# Patient Record
Sex: Male | Born: 1990 | Race: Black or African American | Hispanic: No | Marital: Single | Smoking: Former smoker
Health system: Southern US, Community
[De-identification: ages and names within clinical notes are randomized; demographics above are authoritative.]

## PROBLEM LIST (undated history)

## (undated) DIAGNOSIS — L309 Dermatitis, unspecified: Secondary | ICD-10-CM

## (undated) HISTORY — PX: NO PAST SURGERIES: SHX2092

---
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
## 2009-10-02 ENCOUNTER — Emergency Department: Payer: Self-pay | Admitting: Emergency Medicine

## 2010-02-07 ENCOUNTER — Emergency Department: Payer: Self-pay | Admitting: Emergency Medicine

## 2010-04-17 ENCOUNTER — Emergency Department: Payer: Self-pay | Admitting: Emergency Medicine

## 2010-04-20 ENCOUNTER — Emergency Department: Payer: Self-pay | Admitting: Emergency Medicine

## 2010-09-09 ENCOUNTER — Emergency Department: Payer: Self-pay | Admitting: Unknown Physician Specialty

## 2010-10-03 ENCOUNTER — Emergency Department: Payer: Self-pay | Admitting: Emergency Medicine

## 2011-01-10 ENCOUNTER — Emergency Department: Payer: Self-pay | Admitting: Emergency Medicine

## 2011-04-29 ENCOUNTER — Emergency Department: Payer: Self-pay | Admitting: *Deleted

## 2011-12-29 ENCOUNTER — Emergency Department: Payer: Self-pay | Admitting: Emergency Medicine

## 2012-03-30 ENCOUNTER — Emergency Department: Payer: Self-pay | Admitting: Emergency Medicine

## 2012-09-14 ENCOUNTER — Emergency Department: Payer: Self-pay | Admitting: Emergency Medicine

## 2012-09-14 LAB — RAPID INFLUENZA A&B ANTIGENS

## 2012-10-29 ENCOUNTER — Emergency Department: Payer: Self-pay | Admitting: Emergency Medicine

## 2012-12-12 ENCOUNTER — Emergency Department: Payer: Self-pay | Admitting: Emergency Medicine

## 2012-12-28 ENCOUNTER — Emergency Department: Payer: Self-pay | Admitting: Emergency Medicine

## 2012-12-28 LAB — URINALYSIS, COMPLETE
Bacteria: NONE SEEN
Nitrite: NEGATIVE
Ph: 9 (ref 4.5–8.0)
RBC,UR: 25 /HPF (ref 0–5)
Specific Gravity: 1.026 (ref 1.003–1.030)
Squamous Epithelial: <1

## 2014-07-12 ENCOUNTER — Emergency Department: Payer: Self-pay | Admitting: Student

## 2014-07-12 LAB — URINALYSIS, COMPLETE
BILIRUBIN, UR: NEGATIVE
BLOOD: NEGATIVE
Bacteria: NONE SEEN
GLUCOSE, UR: NEGATIVE mg/dL (ref 0–75)
Ketone: NEGATIVE
Leukocyte Esterase: NEGATIVE
Nitrite: NEGATIVE
PH: 6 (ref 4.5–8.0)
Protein: NEGATIVE
Specific Gravity: 1.028 (ref 1.003–1.030)
Squamous Epithelial: NONE SEEN
WBC UR: 1 /HPF (ref 0–5)

## 2014-07-12 LAB — GC/CHLAMYDIA PROBE AMP

## 2015-08-30 ENCOUNTER — Emergency Department
Admission: EM | Admit: 2015-08-30 | Discharge: 2015-08-30 | Disposition: A | Discharge status: Home / Self Care | Payer: BLUE CROSS/BLUE SHIELD | Attending: Emergency Medicine | Admitting: Emergency Medicine

## 2015-08-30 ENCOUNTER — Encounter: Payer: Self-pay | Admitting: *Deleted

## 2015-08-30 ENCOUNTER — Emergency Department: Payer: BLUE CROSS/BLUE SHIELD

## 2015-08-30 DIAGNOSIS — S91302A Unspecified open wound, left foot, initial encounter: Secondary | ICD-10-CM | POA: Diagnosis not present

## 2015-08-30 DIAGNOSIS — Y9289 Other specified places as the place of occurrence of the external cause: Secondary | ICD-10-CM | POA: Insufficient documentation

## 2015-08-30 DIAGNOSIS — S91301A Unspecified open wound, right foot, initial encounter: Secondary | ICD-10-CM | POA: Diagnosis not present

## 2015-08-30 DIAGNOSIS — Y998 Other external cause status: Secondary | ICD-10-CM | POA: Insufficient documentation

## 2015-08-30 DIAGNOSIS — Y9389 Activity, other specified: Secondary | ICD-10-CM | POA: Diagnosis not present

## 2015-08-30 DIAGNOSIS — Z23 Encounter for immunization: Secondary | ICD-10-CM | POA: Diagnosis not present

## 2015-08-30 DIAGNOSIS — W3400XA Accidental discharge from unspecified firearms or gun, initial encounter: Secondary | ICD-10-CM

## 2015-08-30 MED ORDER — CEFAZOLIN SODIUM 1-5 GM-% IV SOLN
1.0000 g | Freq: Once | INTRAVENOUS | Status: AC
  Administered 2015-08-30: 1 g via INTRAVENOUS

## 2015-08-30 MED ORDER — BACITRACIN-NEOMYCIN-POLYMYXIN 400-5-5000 EX OINT: TOPICAL_OINTMENT | Freq: Once | CUTANEOUS | Status: DC

## 2015-08-30 MED ORDER — BACITRACIN ZINC 500 UNIT/GM EX OINT
TOPICAL_OINTMENT | CUTANEOUS | Status: AC
  Administered 2015-08-30: 1
  Filled 2015-08-30: qty 0.9

## 2015-08-30 MED ORDER — TETANUS-DIPHTH-ACELL PERTUSSIS 5-2.5-18.5 LF-MCG/0.5 IM SUSP
0.5000 mL | Freq: Once | INTRAMUSCULAR | Status: AC
  Administered 2015-08-30: 0.5 mL via INTRAMUSCULAR
  Filled 2015-08-30: qty 0.5

## 2015-08-30 MED ORDER — ACETAMINOPHEN 325 MG PO TABS
650.0000 mg | ORAL_TABLET | Freq: Once | ORAL | Status: AC
  Administered 2015-08-30: 650 mg via ORAL
  Filled 2015-08-30: qty 2

## 2015-08-30 MED ORDER — CEFAZOLIN SODIUM 1-5 GM-% IV SOLN
INTRAVENOUS | Status: AC
  Administered 2015-08-30: 1 g via INTRAVENOUS
  Filled 2015-08-30: qty 50

## 2015-08-30 NOTE — ED Notes (Signed)
All belongings in paper bag given to DETECTIVE al smith.

## 2015-08-30 NOTE — ED Notes (Signed)
Bacitracin and dsd to right le and left foot with kerlix.

## 2015-08-30 NOTE — ED Notes (Signed)
All clothes in paper bag at nursing station with patient label on them.
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
## 2015-08-30 NOTE — ED Notes (Signed)
X-ray at bedside
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
## 2015-08-30 NOTE — ED Notes (Signed)
MD norman at bedside

## 2016-08-01 ENCOUNTER — Emergency Department
Admission: EM | Admit: 2016-08-01 | Discharge: 2016-08-01 | Disposition: A | Discharge status: Home / Self Care | Payer: BLUE CROSS/BLUE SHIELD | Attending: Emergency Medicine | Admitting: Emergency Medicine

## 2016-08-01 ENCOUNTER — Encounter: Payer: Self-pay | Admitting: Emergency Medicine

## 2016-08-01 DIAGNOSIS — R369 Urethral discharge, unspecified: Secondary | ICD-10-CM

## 2016-08-01 DIAGNOSIS — N342 Other urethritis: Secondary | ICD-10-CM

## 2016-08-01 LAB — URINALYSIS COMPLETE WITH MICROSCOPIC (ARMC ONLY)
BILIRUBIN URINE: NEGATIVE
Bacteria, UA: NONE SEEN
GLUCOSE, UA: NEGATIVE mg/dL
KETONES UR: NEGATIVE mg/dL
NITRITE: NEGATIVE
Protein, ur: 30 mg/dL — AB
Specific Gravity, Urine: 1.023 (ref 1.005–1.030)
Squamous Epithelial / LPF: NONE SEEN
pH: 6 (ref 5.0–8.0)

## 2016-08-01 LAB — CHLAMYDIA/NGC RT PCR (ARMC ONLY)
Chlamydia Tr: NOT DETECTED
N GONORRHOEAE: DETECTED — AB

## 2016-08-01 MED ORDER — CEFTRIAXONE SODIUM 1 G IJ SOLR
500.0000 mg | Freq: Once | INTRAMUSCULAR | Status: AC
  Administered 2016-08-01: 500 mg via INTRAMUSCULAR
  Filled 2016-08-01: qty 10

## 2016-08-01 MED ORDER — AZITHROMYCIN 500 MG PO TABS
1000.0000 mg | ORAL_TABLET | Freq: Once | ORAL | Status: AC
  Administered 2016-08-01: 1000 mg via ORAL
  Filled 2016-08-01: qty 2

## 2016-08-01 NOTE — Discharge Instructions (Signed)
Please avoid any sexual activity for one week. Follow-up with health department for recheck in one week. Return to the ER for any worsening symptoms urgent changes in her health.

## 2016-08-01 NOTE — ED Provider Notes (Signed)
[facility]-EMERGENCY DEPARTMENT Provider Note   CSN: [id number] Arrival date & time: 08/01/16  1751     History   Chief Complaint Chief Complaint  Patient presents with  . Exposure to STD    HPI Charles Whitehead is a 25 y.o. male for evaluation of penile discharge. Patient has had one day of penile discharge and burning with urination. Patient has had a new male sexual partner for the last 3-4 weeks. She denies any symptoms. Patient denies any fevers, back pain, abdominal pain. He has no history of STD. He denies any testicular pain or swelling. Patient's pain is moderate to severe with urination he describes it as burning. He has no pain with rest .   HPI  History reviewed. No pertinent past medical history.  There are no active problems to display for this patient.   History reviewed. No pertinent surgical history.     Home Medications    Prior to Admission medications   Not on File    Family History No family history on file.  Social History Social History  Substance Use Topics  . Smoking status: Never Smoker  . Smokeless tobacco: Never Used  . Alcohol use No     Allergies   Patient has no known allergies.   Review of Systems Review of Systems  Constitutional: Negative.  Negative for activity change, appetite change, chills and fever.  HENT: Negative for congestion, ear pain, mouth sores, rhinorrhea, sinus pressure, sore throat and trouble swallowing.   Eyes: Negative for photophobia, pain and discharge.  Respiratory: Negative for cough, chest tightness and shortness of breath.   Cardiovascular: Negative for chest pain and leg swelling.  Gastrointestinal: Negative for abdominal distention, abdominal pain, diarrhea, nausea and vomiting.  Genitourinary: Positive for discharge and dysuria. Negative for difficulty urinating, penile swelling, scrotal swelling, testicular pain and urgency.  Musculoskeletal: Negative for arthralgias, back pain and gait problem.    Skin: Negative for color change and rash.  Neurological: Negative for dizziness and headaches.  Hematological: Negative for adenopathy.  Psychiatric/Behavioral: Negative for agitation and behavioral problems.     Physical Exam Updated Vital Signs BP 121/76 (BP Location: Right Arm)   Pulse (!) 57   Temp 98.4 F (36.9 C) (Oral)   Resp 18   Ht 5\' 8"  (1.727 m)   Wt 68 kg   SpO2 98%   BMI 22.81 kg/m   Physical Exam  Constitutional: He appears well-developed and well-nourished.  HENT:  Head: Normocephalic and atraumatic.  Eyes: Conjunctivae and EOM are normal. Pupils are equal, round, and reactive to light.  Neck: Normal range of motion. Neck supple.  Cardiovascular: Normal rate and regular rhythm.   No murmur heard. Pulmonary/Chest: Effort normal and breath sounds normal. No respiratory distress.  Abdominal: Soft. There is no tenderness.  Genitourinary: Penis normal. Right testis shows no mass, no swelling and no tenderness. Right testis is descended. Left testis shows no mass, no swelling and no tenderness. Left testis is descended. Circumcised. No penile tenderness.  Genitourinary Comments: Mild clear penile discharge with no lesions on the penile shaft. No scrotal tenderness or swelling. No epididymal tenderness.  Musculoskeletal: He exhibits no edema.  Neurological: He is alert.  Skin: Skin is warm and dry.  Psychiatric: He has a normal mood and affect.  Nursing note and vitals reviewed.    ED Treatments / Results  Labs (all labs ordered are listed, but only abnormal results are displayed) Labs Reviewed  URINALYSIS COMPLETEWITH MICROSCOPIC ([facility] ONLY) - Abnormal;  Notable for the following:       Result Value   Color, Urine YELLOW (*)    APPearance HAZY (*)    Hgb urine dipstick 1+ (*)    Protein, ur 30 (*)    Leukocytes, UA 3+ (*)    All other components within normal limits  CHLAMYDIA/NGC RT PCR [facility] ONLY)    EKG  EKG Interpretation None        Radiology No results found.  Procedures Procedures (including critical care time)  Medications Ordered in ED Medications  cefTRIAXone (ROCEPHIN) injection 500 mg (500 mg Intramuscular Given 08/01/16 1845)  azithromycin (ZITHROMAX) tablet 1,000 mg (1,000 mg Oral Given 08/01/16 1844)     Initial Impression / Assessment and Plan / ED Course  I have reviewed the triage vital signs and the nursing notes.  Pertinent labs & imaging results that were available during my care of the patient were reviewed by me and considered in my medical decision making (see chart for details).  Clinical Course     25 year old male with urethritis. He is treated for gonorrhea and chlamydia. He'll follow with health department in one week for repeat evaluation. Return to the ER for any worsening symptoms urgent changes in his health. He'll avoid any sexual activity until after follow-up with health Department symptoms have resolved. No he will notify sexual partners.  Final Clinical Impressions(s) / ED Diagnoses   Final diagnoses:  Penile discharge  Urethritis    New Prescriptions New Prescriptions   No medications on file     Evon Slackhomas C Saintclair Schroader, PA-C 08/01/16 1907    Minna AntisKevin Paduchowski, MD 08/01/16 2255

## 2016-08-01 NOTE — ED Triage Notes (Signed)
Pt in via POV with complaints of penile pain and yellow discharge with urination x one day.  Pt reports new sexual partner within the last three weeks.  Pt A/Ox4, vitals WDL, no immediate distress noted.

## 2016-08-04 ENCOUNTER — Telehealth: Payer: Self-pay | Admitting: Emergency Medicine

## 2016-08-04 NOTE — Telephone Encounter (Signed)
Called patient to give std results to patient.  He was treated while here.  I left message asking him to call me.

## 2017-04-04 ENCOUNTER — Encounter: Payer: Self-pay | Admitting: Emergency Medicine

## 2017-04-04 ENCOUNTER — Emergency Department: Payer: Self-pay

## 2017-04-04 ENCOUNTER — Emergency Department
Admission: EM | Admit: 2017-04-04 | Discharge: 2017-04-04 | Disposition: A | Discharge status: Home / Self Care | Payer: Self-pay | Attending: Emergency Medicine | Admitting: Emergency Medicine

## 2017-04-04 DIAGNOSIS — M549 Dorsalgia, unspecified: Secondary | ICD-10-CM

## 2017-04-04 DIAGNOSIS — F17290 Nicotine dependence, other tobacco product, uncomplicated: Secondary | ICD-10-CM | POA: Insufficient documentation

## 2017-04-04 DIAGNOSIS — S3992XA Unspecified injury of lower back, initial encounter: Secondary | ICD-10-CM | POA: Insufficient documentation

## 2017-04-04 DIAGNOSIS — S161XXA Strain of muscle, fascia and tendon at neck level, initial encounter: Secondary | ICD-10-CM | POA: Insufficient documentation

## 2017-04-04 DIAGNOSIS — Y998 Other external cause status: Secondary | ICD-10-CM | POA: Insufficient documentation

## 2017-04-04 DIAGNOSIS — G501 Atypical facial pain: Secondary | ICD-10-CM | POA: Insufficient documentation

## 2017-04-04 DIAGNOSIS — Y929 Unspecified place or not applicable: Secondary | ICD-10-CM | POA: Insufficient documentation

## 2017-04-04 DIAGNOSIS — Y9389 Activity, other specified: Secondary | ICD-10-CM | POA: Insufficient documentation

## 2017-04-04 LAB — URINE DRUG SCREEN, QUALITATIVE (ARMC ONLY)
Amphetamines, Ur Screen: NOT DETECTED
BARBITURATES, UR SCREEN: NOT DETECTED
Benzodiazepine, Ur Scrn: NOT DETECTED
CANNABINOID 50 NG, UR ~~LOC~~: POSITIVE — AB
Cocaine Metabolite,Ur ~~LOC~~: NOT DETECTED
MDMA (Ecstasy)Ur Screen: NOT DETECTED
Methadone Scn, Ur: NOT DETECTED
Opiate, Ur Screen: NOT DETECTED
PHENCYCLIDINE (PCP) UR S: NOT DETECTED
TRICYCLIC, UR SCREEN: NOT DETECTED

## 2017-04-04 MED ORDER — NAPROXEN 500 MG PO TABS
500.0000 mg | ORAL_TABLET | Freq: Once | ORAL | Status: AC
  Administered 2017-04-04: 500 mg via ORAL
  Filled 2017-04-04: qty 1

## 2017-04-04 MED ORDER — NAPROXEN 500 MG PO TABS: 500.0000 mg | ORAL_TABLET | Freq: Two times a day (BID) | ORAL | 0 refills | Status: AC

## 2017-04-04 NOTE — ED Notes (Signed)
Pt given pair of socks. No shoes or socks on arrival
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
## 2017-11-04 ENCOUNTER — Emergency Department
Admission: EM | Admit: 2017-11-04 | Discharge: 2017-11-04 | Disposition: A | Discharge status: Home / Self Care | Payer: BLUE CROSS/BLUE SHIELD | Attending: Emergency Medicine | Admitting: Emergency Medicine

## 2017-11-04 ENCOUNTER — Encounter: Payer: Self-pay | Admitting: Emergency Medicine

## 2017-11-04 ENCOUNTER — Other Ambulatory Visit: Payer: Self-pay

## 2017-11-04 DIAGNOSIS — B349 Viral infection, unspecified: Secondary | ICD-10-CM | POA: Insufficient documentation

## 2017-11-04 DIAGNOSIS — J069 Acute upper respiratory infection, unspecified: Secondary | ICD-10-CM | POA: Insufficient documentation

## 2017-11-04 DIAGNOSIS — H9202 Otalgia, left ear: Secondary | ICD-10-CM | POA: Insufficient documentation

## 2017-11-04 DIAGNOSIS — F17290 Nicotine dependence, other tobacco product, uncomplicated: Secondary | ICD-10-CM | POA: Insufficient documentation

## 2017-11-04 DIAGNOSIS — Z79899 Other long term (current) drug therapy: Secondary | ICD-10-CM | POA: Insufficient documentation

## 2017-11-04 LAB — INFLUENZA PANEL BY PCR (TYPE A & B)
Influenza A By PCR: NEGATIVE
Influenza B By PCR: NEGATIVE

## 2017-11-04 MED ORDER — ONDANSETRON 4 MG PO TBDP: 4.0000 mg | ORAL_TABLET | Freq: Three times a day (TID) | ORAL | 0 refills | Status: DC | PRN

## 2017-11-04 MED ORDER — PSEUDOEPH-BROMPHEN-DM 30-2-10 MG/5ML PO SYRP: 5.0000 mL | ORAL_SOLUTION | Freq: Four times a day (QID) | ORAL | 0 refills | Status: DC | PRN

## 2017-11-04 MED ORDER — IBUPROFEN 800 MG PO TABS
800.0000 mg | ORAL_TABLET | Freq: Once | ORAL | Status: AC
  Administered 2017-11-04: 800 mg via ORAL
  Filled 2017-11-04: qty 1

## 2017-11-04 NOTE — Discharge Instructions (Signed)
Please alternate Tylenol and ibuprofen as needed for fevers and body aches.  Take cough medication as needed for cough congestion runny nose.  Use Zofran as needed for nausea.  Make sure drinking lots of fluids.  Return to the ER for any worsening symptoms or urgent changes

## 2017-11-04 NOTE — ED Provider Notes (Signed)
[facility] EMERGENCY DEPARTMENT Provider Note   CSN: [id number] Arrival date & time: 11/04/17  1912     History   Chief Complaint Chief Complaint  Patient presents with  . Nasal Congestion  . Fever  . Otalgia    HPI Charles Whitehead is a 27 y.o. male presents to the emergency department for evaluation of cough, congestion, fever, runny nose, left ear pain, nausea and vomiting.  Patient states symptoms began 2 days ago.  He has had fevers up to 101.  He has taken Tylenol but no ibuprofen.  He denies any chest pain, shortness of breath.  Cough is nonproductive.  He currently denies any abdominal pain but occasionally will have some nausea without vomiting.  Patient last vomited 24 hours ago.  He is also had a couple of episodes of loose stools.   HPI  History reviewed. No pertinent past medical history.  There are no active problems to display for this patient.   History reviewed. No pertinent surgical history.     Home Medications    Prior to Admission medications   Medication Sig Start Date End Date Taking? Authorizing Provider  brompheniramine-pseudoephedrine-DM 30-2-10 MG/5ML syrup Take 5 mLs by mouth 4 (four) times daily as needed. 11/04/17   Evon Slack, PA-C  naproxen (NAPROSYN) 500 MG tablet Take 1 tablet (500 mg total) by mouth 2 (two) times daily with a meal. 04/04/17 04/04/18  Little, Traci M, PA-C  ondansetron (ZOFRAN ODT) 4 MG disintegrating tablet Take 1 tablet (4 mg total) by mouth every 8 (eight) hours as needed for nausea or vomiting. 11/04/17   Evon Slack, PA-C    Family History No family history on file.  Social History Social History   Tobacco Use  . Smoking status: Current Every Day Smoker    Types: Cigars  . Smokeless tobacco: Never Used  Substance Use Topics  . Alcohol use: No  . Drug use: Yes    Types: Marijuana     Allergies   Patient has no known allergies.   Review of Systems Review of Systems    Constitutional: Positive for fever.  HENT: Positive for congestion, rhinorrhea and sore throat. Negative for sinus pressure, sinus pain and trouble swallowing.   Respiratory: Positive for cough. Negative for wheezing and stridor.   Gastrointestinal: Positive for diarrhea, nausea and vomiting. Negative for abdominal pain.  Genitourinary: Negative for dysuria.  Musculoskeletal: Positive for myalgias. Negative for arthralgias and neck stiffness.  Skin: Negative for rash.  Neurological: Negative for dizziness.     Physical Exam Updated Vital Signs BP (!) 141/75 (BP Location: Left Arm)   Pulse 99   Temp 97.9 F (36.6 C) (Oral)   Resp 20   Ht 5\' 7"  (1.702 m)   Wt 65.8 kg (145 lb)   SpO2 97%   BMI 22.71 kg/m   Physical Exam  Constitutional: He is oriented to person, place, and time. He appears well-developed and well-nourished. No distress.  HENT:  Head: Normocephalic and atraumatic.  Right Ear: Hearing, tympanic membrane, external ear and ear canal normal.  Left Ear: Hearing, tympanic membrane, external ear and ear canal normal.  Nose: Rhinorrhea present. No nasal septal hematoma. Right sinus exhibits no maxillary sinus tenderness and no frontal sinus tenderness. Left sinus exhibits no maxillary sinus tenderness and no frontal sinus tenderness.  Mouth/Throat: Oropharynx is clear and moist. No trismus in the jaw. No uvula swelling. No oropharyngeal exudate, posterior oropharyngeal edema, posterior oropharyngeal erythema or tonsillar  abscesses.  No pharyngeal erythema or exudates.  Uvula is midline.  Mild rhinorrhea with mild mucosal edema.  Bilateral TMs are normal with no signs of infection or perforation.  Eyes: Conjunctivae are normal.  Neck: Normal range of motion.  Cardiovascular: Normal rate and regular rhythm.  Pulmonary/Chest: No stridor. No respiratory distress. He has no wheezes. He exhibits no tenderness.  Abdominal: Soft. He exhibits no distension. There is no tenderness.  There is no guarding.  Musculoskeletal: Normal range of motion.  Lymphadenopathy:    He has cervical adenopathy (posterior cervical).  Neurological: He is alert and oriented to person, place, and time.  Skin: Skin is warm and dry. No rash noted.  Psychiatric: He has a normal mood and affect. His behavior is normal. Judgment and thought content normal.     ED Treatments / Results  Labs (all labs ordered are listed, but only abnormal results are displayed) Labs Reviewed  INFLUENZA PANEL BY PCR (TYPE A & B)    EKG  EKG Interpretation None       Radiology No results found.  Procedures Procedures (including critical care time)  Medications Ordered in ED Medications  ibuprofen (ADVIL,MOTRIN) tablet 800 mg (800 mg Oral Given 11/04/17 2053)     Initial Impression / Assessment and Plan / ED Course  I have reviewed the triage vital signs and the nursing notes.  Pertinent labs & imaging results that were available during my care of the patient were reviewed by me and considered in my medical decision making (see chart for details).     27 year old male with viral illness.  He has had intermittent fevers that responded well to Tylenol.  Nausea and vomiting mostly resolved, has mild intermittent nausea.  He is given prescription for Zofran, Bromfed.  He is educated on signs and symptoms to return to the ED for.  He will increase fluids.  Final Clinical Impressions(s) / ED Diagnoses   Final diagnoses:  Viral upper respiratory tract infection  Left ear pain  Viral illness    ED Discharge Orders        Ordered    brompheniramine-pseudoephedrine-DM 30-2-10 MG/5ML syrup  4 times daily PRN     11/04/17 2100    ondansetron (ZOFRAN ODT) 4 MG disintegrating tablet  Every 8 hours PRN     11/04/17 2100       Ronnette JuniperGaines, Thomas C, PA-C 11/04/17 2116    Myrna BlazerSchaevitz, David Matthew, MD 11/05/17 [phone]

## 2017-11-04 NOTE — ED Triage Notes (Addendum)
Patient ambulatory to triage with steady gait, without difficulty or distress noted; pt reports congestion, ears ringing/pain, sinus pressure, fever, cough x 2 days; mask applied

## 2018-02-21 ENCOUNTER — Emergency Department
Admission: EM | Admit: 2018-02-21 | Discharge: 2018-02-21 | Disposition: A | Discharge status: Home / Self Care | Payer: Self-pay | Attending: Emergency Medicine | Admitting: Emergency Medicine

## 2018-02-21 ENCOUNTER — Other Ambulatory Visit: Payer: Self-pay

## 2018-02-21 ENCOUNTER — Emergency Department: Payer: Self-pay

## 2018-02-21 DIAGNOSIS — F17210 Nicotine dependence, cigarettes, uncomplicated: Secondary | ICD-10-CM | POA: Insufficient documentation

## 2018-02-21 DIAGNOSIS — R109 Unspecified abdominal pain: Secondary | ICD-10-CM

## 2018-02-21 DIAGNOSIS — K529 Noninfective gastroenteritis and colitis, unspecified: Secondary | ICD-10-CM | POA: Insufficient documentation

## 2018-02-21 LAB — URINALYSIS, COMPLETE (UACMP) WITH MICROSCOPIC
Bacteria, UA: NONE SEEN
Bilirubin Urine: NEGATIVE
GLUCOSE, UA: NEGATIVE mg/dL
Hgb urine dipstick: NEGATIVE
Ketones, ur: 20 mg/dL — AB
Leukocytes, UA: NEGATIVE
Nitrite: NEGATIVE
Protein, ur: NEGATIVE mg/dL
Specific Gravity, Urine: >1.046 — ABNORMAL HIGH (ref 1.005–1.030)
WBC UA: NONE SEEN WBC/hpf (ref 0–5)
pH: 6 (ref 5.0–8.0)

## 2018-02-21 LAB — CBC WITH DIFFERENTIAL/PLATELET
BASOS PCT: 1 %
Basophils Absolute: 0 10*3/uL (ref 0–0.1)
EOS ABS: 0 10*3/uL (ref 0–0.7)
EOS PCT: 1 %
HCT: 44 % (ref 40.0–52.0)
Hemoglobin: 14.8 g/dL (ref 13.0–18.0)
LYMPHS ABS: 1.5 10*3/uL (ref 1.0–3.6)
Lymphocytes Relative: 27 %
MCH: 31.2 pg (ref 26.0–34.0)
MCHC: 33.7 g/dL (ref 32.0–36.0)
MCV: 92.8 fL (ref 80.0–100.0)
Monocytes Absolute: 0.4 10*3/uL (ref 0.2–1.0)
Monocytes Relative: 8 %
NEUTROS PCT: 63 %
Neutro Abs: 3.5 10*3/uL (ref 1.4–6.5)
PLATELETS: 181 10*3/uL (ref 150–440)
RBC: 4.74 MIL/uL (ref 4.40–5.90)
RDW: 12.6 % (ref 11.5–14.5)
WBC: 5.5 10*3/uL (ref 3.8–10.6)

## 2018-02-21 LAB — COMPREHENSIVE METABOLIC PANEL
ALBUMIN: 4.6 g/dL (ref 3.5–5.0)
ALT: 16 U/L — AB (ref 17–63)
ANION GAP: 12 (ref 5–15)
AST: 23 U/L (ref 15–41)
Alkaline Phosphatase: 52 U/L (ref 38–126)
BUN: 17 mg/dL (ref 6–20)
CHLORIDE: 103 mmol/L (ref 101–111)
CO2: 22 mmol/L (ref 22–32)
Calcium: 9.1 mg/dL (ref 8.9–10.3)
Creatinine, Ser: 1 mg/dL (ref 0.61–1.24)
GFR calc Af Amer: >60 mL/min (ref >60)
GFR calc non Af Amer: >60 mL/min (ref >60)
GLUCOSE: 111 mg/dL — AB (ref 65–99)
Potassium: 3.6 mmol/L (ref 3.5–5.1)
SODIUM: 137 mmol/L (ref 135–145)
Total Bilirubin: 1.8 mg/dL — ABNORMAL HIGH (ref 0.3–1.2)
Total Protein: 8.4 g/dL — ABNORMAL HIGH (ref 6.5–8.1)

## 2018-02-21 LAB — LIPASE, BLOOD: Lipase: 28 U/L (ref 11–51)

## 2018-02-21 MED ORDER — ONDANSETRON HCL 4 MG/2ML IJ SOLN
4.0000 mg | Freq: Once | INTRAMUSCULAR | Status: AC
  Administered 2018-02-21: 4 mg via INTRAVENOUS
  Filled 2018-02-21: qty 2

## 2018-02-21 MED ORDER — IOPAMIDOL (ISOVUE-300) INJECTION 61%
30.0000 mL | Freq: Once | INTRAVENOUS | Status: AC | PRN
  Administered 2018-02-21: 30 mL via ORAL

## 2018-02-21 MED ORDER — SODIUM CHLORIDE 0.9 % IV BOLUS
1000.0000 mL | Freq: Once | INTRAVENOUS | Status: AC
  Administered 2018-02-21: 1000 mL via INTRAVENOUS

## 2018-02-21 MED ORDER — MORPHINE SULFATE (PF) 4 MG/ML IV SOLN
4.0000 mg | Freq: Once | INTRAVENOUS | Status: AC
  Administered 2018-02-21: 4 mg via INTRAVENOUS
  Filled 2018-02-21: qty 1

## 2018-02-21 MED ORDER — ONDANSETRON HCL 4 MG PO TABS: 4.0000 mg | ORAL_TABLET | Freq: Three times a day (TID) | ORAL | 0 refills | Status: DC | PRN

## 2018-02-21 MED ORDER — IOPAMIDOL (ISOVUE-300) INJECTION 61%
100.0000 mL | Freq: Once | INTRAVENOUS | Status: AC | PRN
  Administered 2018-02-21: 100 mL via INTRAVENOUS

## 2018-02-21 NOTE — ED Triage Notes (Signed)
Pt c/o RLQ pain since yesterday with N/V/D.

## 2018-02-21 NOTE — ED Provider Notes (Addendum)
[facility] Emergency Department Provider Note  ____________________________________________   I have reviewed the triage vital signs and the nursing notes. Where available I have reviewed prior notes and, if possible and indicated, outside hospital notes.    HISTORY  Chief Complaint Abdominal Pain    HPI Charles Whitehead is a 27 y.o. male who presents today complaining of right lower quadrant abdominal pain, he specifies McBurney's point.  Pain is been there for 4 days or so.  Get gradually worse.  Has a fever at home up over 100.  Positive vomiting last night.  Pain is significant and worsening.  Did state that the car ride here was uncomfortable.  Has never had anything like this before no testicular or penile pain no flank pain or urinary symptoms, denies chest pain, pain is worse when he touches it or moves around, nothing seems to make it better, persistent for several days, no other associated symptoms no other prior treatment, is a sharp discomfort with no radiation     History reviewed. No pertinent past medical history.  There are no active problems to display for this patient.   History reviewed. No pertinent surgical history.  Prior to Admission medications   Medication Sig Start Date End Date Taking? Authorizing Provider  brompheniramine-pseudoephedrine-DM 30-2-10 MG/5ML syrup Take 5 mLs by mouth 4 (four) times daily as needed. 11/04/17   Evon Slack, PA-C  naproxen (NAPROSYN) 500 MG tablet Take 1 tablet (500 mg total) by mouth 2 (two) times daily with a meal. 04/04/17 04/04/18  Little, Traci M, PA-C  ondansetron (ZOFRAN ODT) 4 MG disintegrating tablet Take 1 tablet (4 mg total) by mouth every 8 (eight) hours as needed for nausea or vomiting. 11/04/17   Evon Slack, PA-C    Allergies Lactose intolerance (gi)  No family history on file.  Social History Social History   Tobacco Use  . Smoking status: Current Every Day Smoker    Types:  Cigars  . Smokeless tobacco: Never Used  Substance Use Topics  . Alcohol use: No  . Drug use: Yes    Types: Marijuana    Review of Systems Constitutional: + fever/  -chills Eyes: No visual changes. ENT: No sore throat. No stiff neck no neck pain Cardiovascular: Denies chest pain. Respiratory: Denies shortness of breath. Gastrointestinal:   + vomiting.  + No diarrhea.  No constipation. Genitourinary: Negative for dysuria. Musculoskeletal: Negative lower extremity swelling Skin: Negative for rash. Neurological: Negative for severe headaches, focal weakness or numbness.   ____________________________________________   PHYSICAL EXAM:  VITAL SIGNS: ED Triage Vitals  Enc Vitals Group     BP 02/21/18 0906 (!) 151/68     Pulse Rate 02/21/18 0906 60     Resp 02/21/18 0906 18     Temp 02/21/18 0906 98.1 F (36.7 C)     Temp Source 02/21/18 0906 Oral     SpO2 02/21/18 0906 100 %     Weight 02/21/18 0905 150 lb (68 kg)     Height 02/21/18 0905 5\' 7"  (1.702 m)     Head Circumference --      Peak Flow --      Pain Score 02/21/18 0909 10     Pain Loc --      Pain Edu? --      Excl. in GC? --     Constitutional: Alert and oriented. Well appearing and in no acute distress. Eyes: Conjunctivae are normal Head: Atraumatic HEENT: No congestion/rhinnorhea. Mucous  membranes are moist.  Oropharynx non-erythematous Neck:   Nontender with no meningismus, no masses, no stridor Cardiovascular: Normal rate, regular rhythm. Grossly normal heart sounds.  Good peripheral circulation. Respiratory: Normal respiratory effort.  No retractions. Lungs CTAB. Abdominal: Soft and focal tenderness to McBurney's point voluntary guarding no rebound. No distention.  Soft nonsurgical back:  There is no focal tenderness or step off.  there is no midline tenderness there are no lesions noted. there is no CVA tenderness Normal external male genitalia no testicular pain or swelling no penile lesions or  discharge Musculoskeletal: No lower extremity tenderness, no upper extremity tenderness. No joint effusions, no DVT signs strong distal pulses no edema Neurologic:  Normal speech and language. No gross focal neurologic deficits are appreciated.  Skin:  Skin is warm, dry and intact. No rash noted. Psychiatric: Mood and affect are normal. Speech and behavior are normal.  ____________________________________________   LABS (all labs ordered are listed, but only abnormal results are displayed)  Labs Reviewed  CBC WITH DIFFERENTIAL/PLATELET  COMPREHENSIVE METABOLIC PANEL  LIPASE, BLOOD  URINALYSIS, COMPLETE (UACMP) WITH MICROSCOPIC    Pertinent labs  results that were available during my care of the patient were reviewed by me and considered in my medical decision making (see chart for details). ____________________________________________  EKG  I personally interpreted any EKGs ordered by me or triage  ____________________________________________  RADIOLOGY  Pertinent labs & imaging results that were available during my care of the patient were reviewed by me and considered in my medical decision making (see chart for details). If possible, patient and/or family made aware of any abnormal findings.  No results found. ____________________________________________    PROCEDURES  Procedure(s) performed: None  Procedures  Critical Care performed: None  ____________________________________________   INITIAL IMPRESSION / ASSESSMENT AND PLAN / ED COURSE  Pertinent labs & imaging results that were available during my care of the patient were reviewed by me and considered in my medical decision making (see chart for details).  Patient with right lower quadrant abdominal pain, and vomiting, however, white count is normal afebrile here, no penile or testicular tenderness or pain.  Concern for appendicitis, obtain CT scan and CT scan is normal with no evidence of appendicitis.   There is been gone for 4 days but now he should see something in there if there was significant pathology to be found then we do not.  Patient tolerated his contrast with no difficulty with no vomiting here, very reassuring work-up thus far.  We are awaiting urinalysis on the patient is much more comfortable after pain medications still nonsurgical abdomen  ----------------------------------------- 12:20 PM on 02/21/2018 -----------------------------------------  Abdomen remains nonsurgical, patient is feeling better at this time after fluid, the CT scan is unequivocally negative for anything acute, we will see if we can try to get him safely home.  He has slight ketosis in his urine.  He has vomiting and diarrhea and did a focal abdominal pain but there is no evidence on CT to suggest further observation or admission.  They do visualize the appendix and it is normal.  I think the patient most likely has viral gastroenteritis given these findings, this is very highly prevalent in the environment at this time.  He is very relieved that there is no need for surgery, we will ensure that he can tolerate p.o. prior to discharge.  He did drink the contrast with no difficulty.   ____________________________________________   FINAL CLINICAL IMPRESSION(S) / ED DIAGNOSES  Final  diagnoses:  None      This chart was dictated using voice recognition software.  Despite best efforts to proofread,  errors can occur which can change meaning.      Jeanmarie PlantMcShane, Clarece Drzewiecki A, MD 02/21/18 [phone]    Jeanmarie PlantMcShane, Nilson Tabora A, MD 02/21/18 1056    Jeanmarie PlantMcShane, Alivya Wegman A, MD 02/21/18 [phone]

## 2018-02-21 NOTE — ED Notes (Signed)
Offered pt something to drink, pt declined, states the pain is too bad to drink anything.  Pt did drink oral CT contrast without difficulty.

## 2018-02-21 NOTE — Discharge Instructions (Signed)
You have reassuring blood work, CT scan, and other findings.  If you feel worse in any way including fever, persistent vomiting, worsening pain or you feel other concerns please return to the emergency department.  Follow closely with primary care doctor.

## 2018-06-02 ENCOUNTER — Emergency Department: Payer: Self-pay

## 2018-06-02 ENCOUNTER — Emergency Department
Admission: EM | Admit: 2018-06-02 | Discharge: 2018-06-02 | Disposition: A | Discharge status: Home / Self Care | Payer: Self-pay | Attending: Emergency Medicine | Admitting: Emergency Medicine

## 2018-06-02 ENCOUNTER — Other Ambulatory Visit: Payer: Self-pay

## 2018-06-02 ENCOUNTER — Encounter: Payer: Self-pay | Admitting: Emergency Medicine

## 2018-06-02 DIAGNOSIS — F17290 Nicotine dependence, other tobacco product, uncomplicated: Secondary | ICD-10-CM | POA: Insufficient documentation

## 2018-06-02 DIAGNOSIS — Y9389 Activity, other specified: Secondary | ICD-10-CM | POA: Insufficient documentation

## 2018-06-02 DIAGNOSIS — Y929 Unspecified place or not applicable: Secondary | ICD-10-CM | POA: Insufficient documentation

## 2018-06-02 DIAGNOSIS — Y999 Unspecified external cause status: Secondary | ICD-10-CM | POA: Insufficient documentation

## 2018-06-02 DIAGNOSIS — W208XXA Other cause of strike by thrown, projected or falling object, initial encounter: Secondary | ICD-10-CM | POA: Insufficient documentation

## 2018-06-02 DIAGNOSIS — S60221A Contusion of right hand, initial encounter: Secondary | ICD-10-CM | POA: Insufficient documentation

## 2018-06-02 MED ORDER — IBUPROFEN 800 MG PO TABS: 800.0000 mg | ORAL_TABLET | Freq: Three times a day (TID) | ORAL | 0 refills | Status: DC | PRN

## 2018-06-02 NOTE — ED Triage Notes (Signed)
Pt reports that he was at work a week ago, tools fell on it, he thought the pain and swelling would get better. Swelling and pain has gotten worse. Right hand is slightly swollen states that it hurts to grip things. It hurts more so on his thumb and wrist.

## 2018-06-02 NOTE — Discharge Instructions (Addendum)
Follow up with dr Stephenie Acres or your regular doctor if not better in 5-7 days.  Apply ice to the hand.  No use of the right hand for 7 days.

## 2018-06-02 NOTE — ED Notes (Signed)
Pt verbalized understanding of discharge teaching and paperwork and understood to follow up with orthopaedic doctor in terms of hand pain. Pt understood and received paperwork. Esignature not available.

## 2018-06-02 NOTE — ED Notes (Addendum)
See triage note  Presents with pain and swelling to right hand  States he had a tool drop on his hand about 1 week ago  States pain is mainly at thumb and into wrist

## 2018-06-02 NOTE — ED Provider Notes (Signed)
[facility] Emergency Department Provider Note  ____________________________________________   First MD Initiated Contact with Patient 06/02/18 1819     (approximate)  I have reviewed the triage vital signs and the nursing notes.   HISTORY  Chief Complaint Hand Pain    HPI Luke Falero is a 27 y.o. male presents emergency department complaining of right hand pain.  He states he was on a ladder and came down and the ladder fell dropping his stool bag on top of his hand about a week ago.  He has had continued pain in the right thumb which makes the other fingers hurt too.  He denies any numbness or tingling.  He denies any broken skin.    History reviewed. No pertinent past medical history.  There are no active problems to display for this patient.   History reviewed. No pertinent surgical history.  Prior to Admission medications   Medication Sig Start Date End Date Taking? Authorizing Provider  ibuprofen (ADVIL,MOTRIN) 800 MG tablet Take 1 tablet (800 mg total) by mouth every 8 (eight) hours as needed. 06/02/18   Sherrie Mustache Roselyn Bering, PA-C    Allergies Lactose intolerance (gi)  History reviewed. No pertinent family history.  Social History Social History   Tobacco Use  . Smoking status: Current Every Day Smoker    Types: Cigars  . Smokeless tobacco: Never Used  Substance Use Topics  . Alcohol use: No  . Drug use: Yes    Types: Marijuana    Review of Systems  Constitutional: No fever/chills Eyes: No visual changes. ENT: No sore throat. Respiratory: Denies cough Genitourinary: Negative for dysuria. Musculoskeletal: Negative for back pain.  Positive for right hand and thumb pain Skin: Negative for rash.    ____________________________________________   PHYSICAL EXAM:  VITAL SIGNS: ED Triage Vitals  Enc Vitals Group     BP 06/02/18 1738 127/75     Pulse Rate 06/02/18 1738 62     Resp 06/02/18 1738 18     Temp 06/02/18 1738  98.9 F (37.2 C)     Temp Source 06/02/18 1738 Oral     SpO2 06/02/18 1738 99 %     Weight --      Height 06/02/18 1740 5\' 7"  (1.702 m)     Head Circumference --      Peak Flow --      Pain Score 06/02/18 1739 9     Pain Loc --      Pain Edu? --      Excl. in GC? --     Constitutional: Alert and oriented. Well appearing and in no acute distress. Eyes: Conjunctivae are normal.  Head: Atraumatic. Nose: No congestion/rhinnorhea. Mouth/Throat: Mucous membranes are moist.   Neck:  supple no lymphadenopathy noted Cardiovascular: Normal rate, regular rhythm. Heart sounds are normal Respiratory: Normal respiratory effort.  No retractions, lungs c t a  GU: deferred Musculoskeletal: FROM all extremities, warm and well perfused.  Pain is reproduced with range of motion of the right thumb.  The right thumb is tender to palpation.  The remainder the hand is slightly tender but he still has full range of motion.  There is no swelling noted.  There is no rash noted.  No foreign bodies are noted. Neurologic:  Normal speech and language.  Skin:  Skin is warm, dry and intact. No rash noted. Psychiatric: Mood and affect are normal. Speech and behavior are normal.  ____________________________________________   LABS (all labs ordered are listed,  but only abnormal results are displayed)  Labs Reviewed - No data to display ____________________________________________   ____________________________________________  RADIOLOGY  X-ray of the right hand is negative  ____________________________________________   PROCEDURES  Procedure(s) performed: Velcro thumb spica splint was applied by the nursing staff  Procedures    ____________________________________________   INITIAL IMPRESSION / ASSESSMENT AND PLAN / ED COURSE  Pertinent labs & imaging results that were available during my care of the patient were reviewed by me and considered in my medical decision making (see chart for  details).   Patient is 27 year old male presents emergency department complaining of right hand pain after dropping to [facility] on his hand last week.  He states the right thumb is the worst but the hand continues to hurt after trying over-the-counter medicines.  On physical exam the hand is not swollen.  Right thumb is tender palpation.  Some of the metacarpals are tender.  Patient has full range of motion but pain is reproduced with motion.  Neurovascularly intact.  X-ray of the right hand is negative.  X-ray results were explained to the patient.  Is placed in a thumb spica splint by the nursing staff.  He is to apply ice to the area.  Take Tylenol or ibuprofen as needed for pain.  He is to be on light duty for 1 week raise not using his right hand.  If he is not better at that time he should follow-up with Dr. Stephenie Acres.  He states he understands and will comply.  Was discharged in stable condition     As part of my medical decision making, I reviewed the following data within the electronic MEDICAL RECORD NUMBER Nursing notes reviewed and incorporated, Old chart reviewed, Radiograph reviewed x-ray of the right hand is negative, Notes from prior ED visits and [place] Controlled Substance Database  ____________________________________________   FINAL CLINICAL IMPRESSION(S) / ED DIAGNOSES  Final diagnoses:  Contusion of right hand, initial encounter      NEW MEDICATIONS STARTED DURING THIS VISIT:  New Prescriptions   IBUPROFEN (ADVIL,MOTRIN) 800 MG TABLET    Take 1 tablet (800 mg total) by mouth every 8 (eight) hours as needed.     Note:  This document was prepared using Dragon voice recognition software and may include unintentional dictation errors.    Faythe Ghee, PA-C 06/02/18 2318    Sharman Cheek, MD 06/06/18 [id number]

## 2019-07-06 IMAGING — CT CT ABD-PELV W/ CM
2 of 4 series · 17 of 46 positions shown, 19 images · IV contrast (APPLIED)
Comparison: None.

CLINICAL DATA: Right lower quadrant abdominal pain beginning
yesterday.

EXAM:
CT ABDOMEN AND PELVIS WITH CONTRAST
TECHNIQUE: Multidetector CT imaging of the abdomen and pelvis was performed
using the standard protocol following bolus administration of
intravenous contrast.
CONTRAST:  100mL U684DD-ELL IOPAMIDOL (U684DD-ELL) INJECTION 61%

[Series 2: routine abd/pel with · axial · 0.68mm/px · z∈[-928,-548]mm · 14 of 84 slices shown, 16 images]
[im 4/84  soft-tissue]
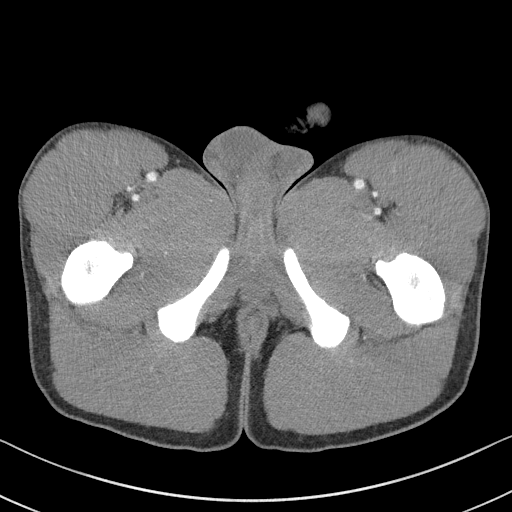
[im 4/84  bone]
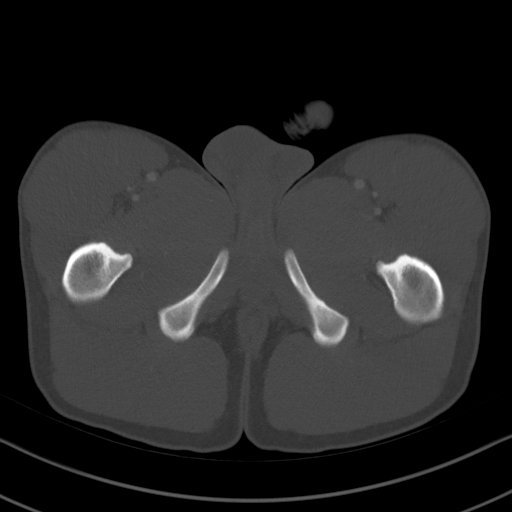
[im 10/84  soft-tissue]
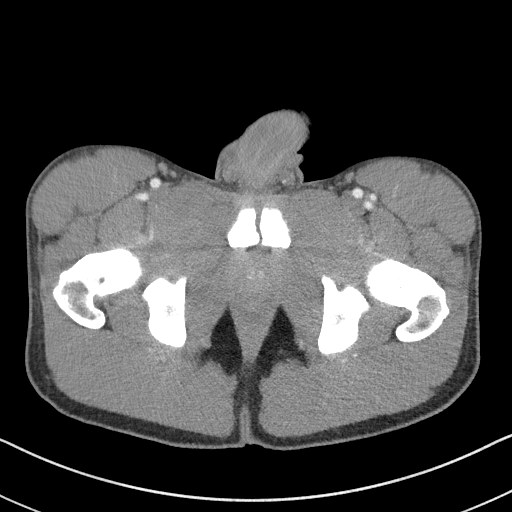
[im 17/84  soft-tissue]
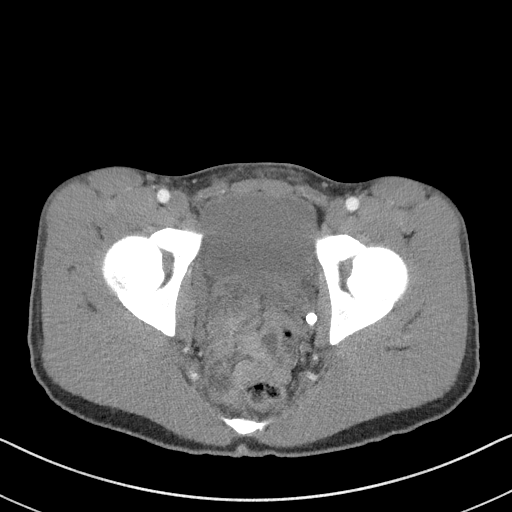
[im 24/84  soft-tissue]
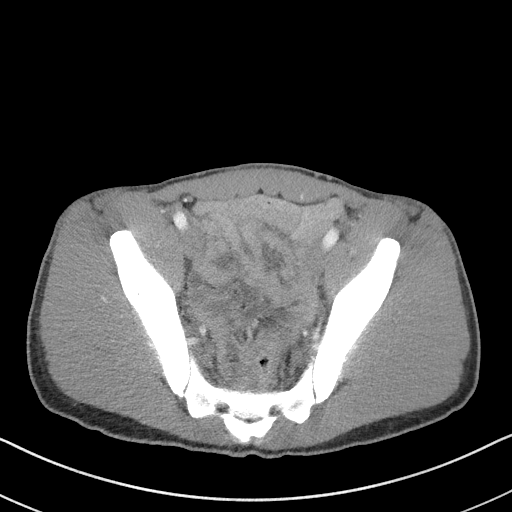
[im 27/84  soft-tissue]
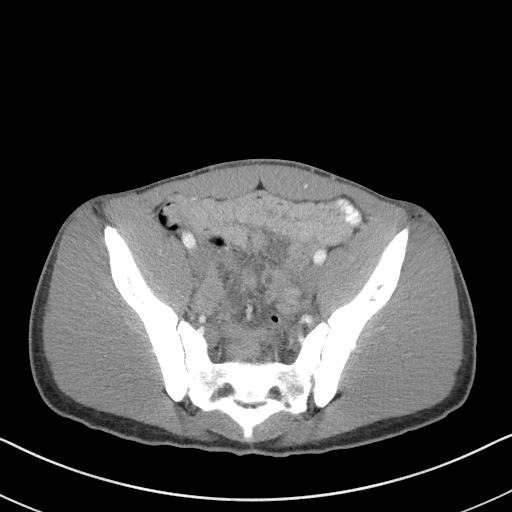
[im 34/84  soft-tissue]
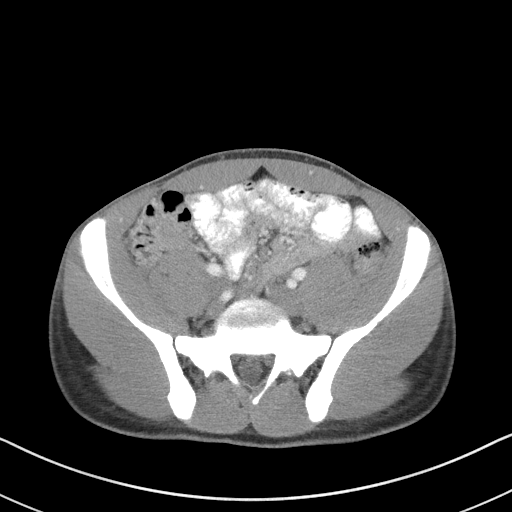
[im 40/84  soft-tissue]
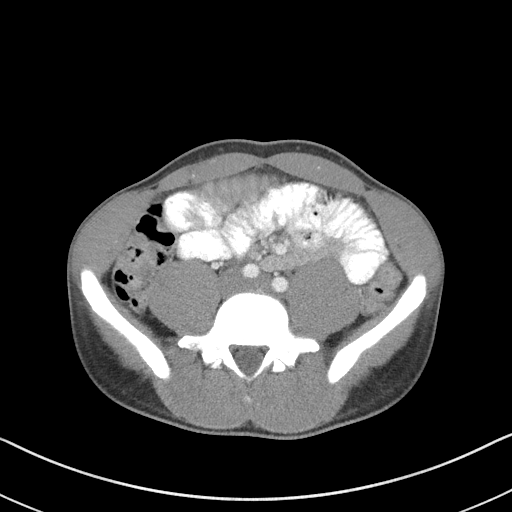
[im 44/84  soft-tissue]
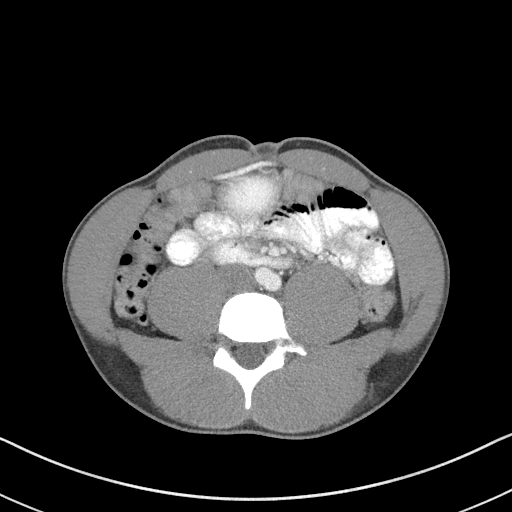
[im 50/84  soft-tissue]
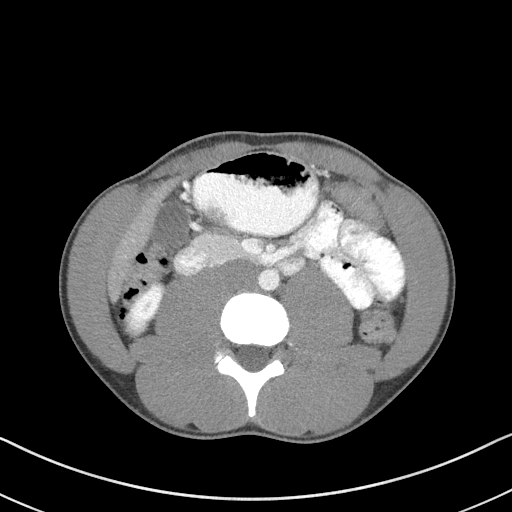
[im 50/84  bone]
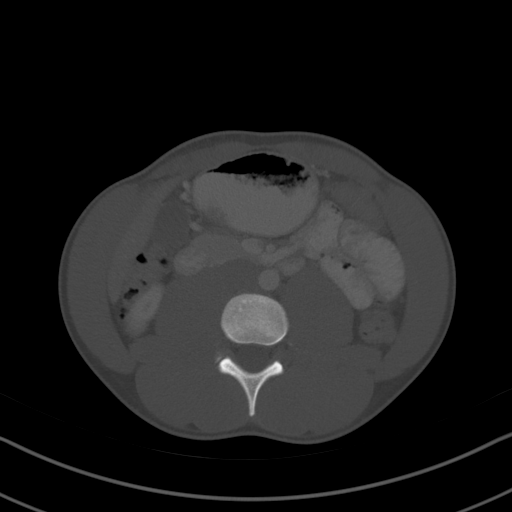
[im 57/84  soft-tissue]
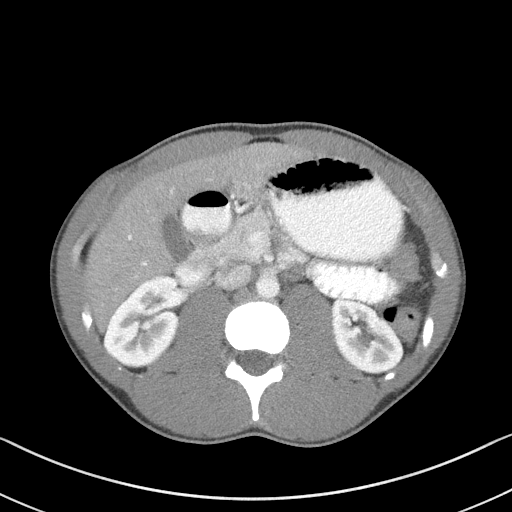
[im 64/84  soft-tissue]
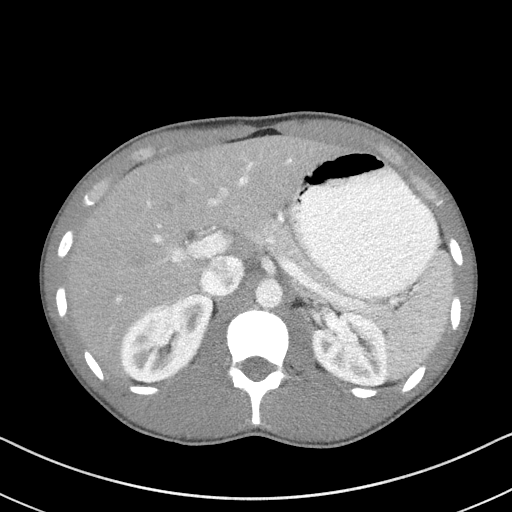
[im 67/84  soft-tissue]
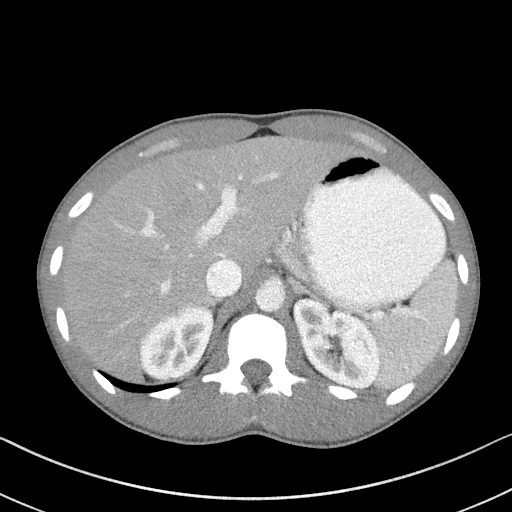
[im 74/84  soft-tissue]
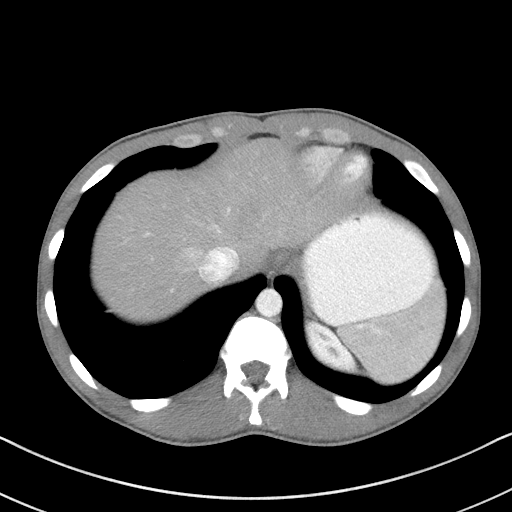
[im 80/84  soft-tissue]
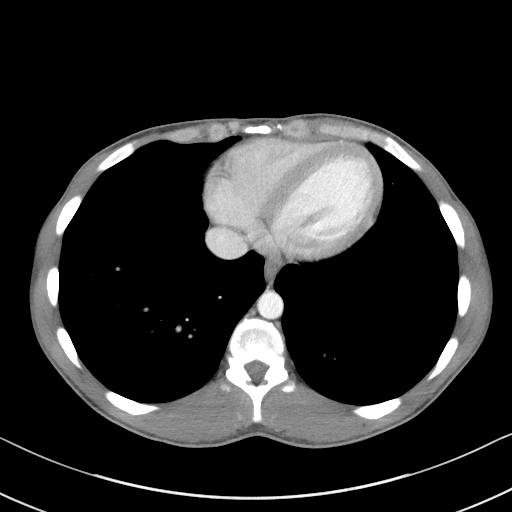

[Series 5: coronal st · coronal · 0.65mm/px · 3 of 76 slices shown]
[im 26/76  soft-tissue]
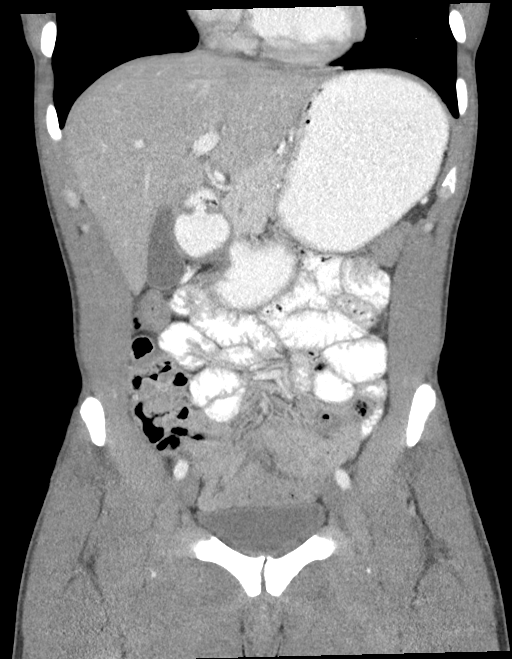
[im 34/76  soft-tissue]
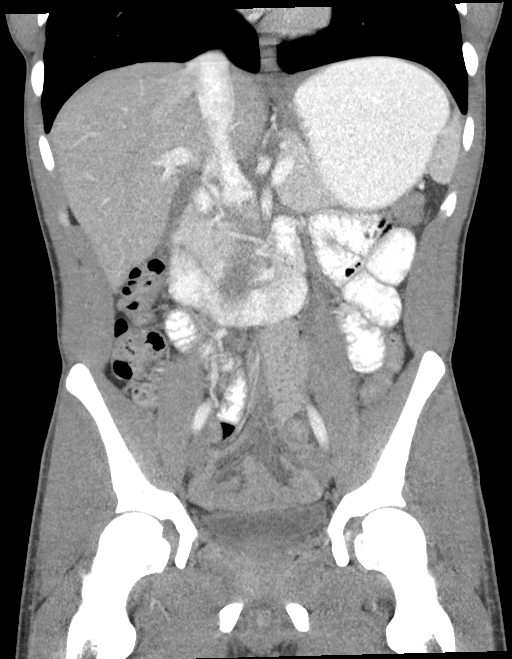
[im 42/76  soft-tissue]
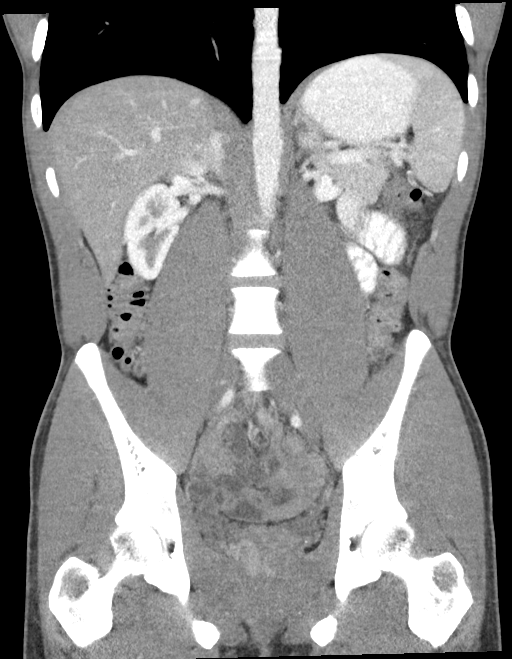

[17 of 46 positions shown; findings below may reference images not displayed]

FINDINGS: Lower chest: Normal

Hepatobiliary: Normal

Pancreas: Normal

Spleen: Normal

Adrenals/Urinary Tract: Adrenal glands are normal. Kidneys are
normal. Bladder is normal.

Stomach/Bowel: No abnormal bowel finding. Normal retrocecal
appendix.

Vascular/Lymphatic: Normal

Reproductive: Normal

Other: No free fluid or air.

Musculoskeletal: Normal
IMPRESSION: Normal examination. No cause of pain identified. Normal retrocecal
appendix.

## 2019-08-31 ENCOUNTER — Ambulatory Visit
Admission: EM | Admit: 2019-08-31 | Discharge: 2019-08-31 | Disposition: A | Discharge status: Home / Self Care | Payer: Self-pay | Attending: Family Medicine | Admitting: Family Medicine

## 2019-08-31 ENCOUNTER — Encounter: Payer: Self-pay | Admitting: Emergency Medicine

## 2019-08-31 ENCOUNTER — Other Ambulatory Visit: Payer: Self-pay

## 2019-08-31 DIAGNOSIS — Z202 Contact with and (suspected) exposure to infections with a predominantly sexual mode of transmission: Secondary | ICD-10-CM

## 2019-08-31 MED ORDER — CEFTRIAXONE SODIUM 250 MG IJ SOLR
250.0000 mg | Freq: Once | INTRAMUSCULAR | Status: AC
  Administered 2019-08-31: 250 mg via INTRAMUSCULAR

## 2019-08-31 MED ORDER — AZITHROMYCIN 500 MG PO TABS
1000.0000 mg | ORAL_TABLET | Freq: Once | ORAL | Status: AC
  Administered 2019-08-31: 19:00:00 1000 mg via ORAL

## 2019-08-31 NOTE — ED Triage Notes (Signed)
Patient here to be checked for GC/Chlamydia. He denies symptoms. Patient was notified that he was with someone that tested positive for trich and gonorrhea.  Patient went to the health department and was told the lab was down. The gave him Metronidazole 500mg  x 4 (2 days ago).

## 2019-08-31 NOTE — ED Provider Notes (Signed)
[facility]    CSN: [id number] Arrival date & time: 08/31/19  1759  History   Chief Complaint Chief Complaint  Patient presents with  . Exposure to STD   HPI  28 year old male presents with the above complaint.  Patient recently informed by his partner that she tested positive for trichomonas.  He was seen at the health department and was treated for trichomonas.  Patient reports that he was again notified by this partner that she tested positive for gonorrhea.  Patient states that he desires empiric treatment today.  Patient denies penile discharge, dysuria.  He states that he is feeling well.  He has no medication allergies.  No other complaints or concerns at this time.  Hx reviewed as below. PMH: Hx of tobacco abuse, Marijuana use  Surgical Hx: Adenoidectomy  Home Medications    Prior to Admission medications   Medication Sig Start Date End Date Taking? Authorizing Provider  ibuprofen (ADVIL,MOTRIN) 800 MG tablet Take 1 tablet (800 mg total) by mouth every 8 (eight) hours as needed. 06/02/18   Faythe Ghee, PA-C   Social History Social History   Tobacco Use  . Smoking status: Former Smoker    Types: Cigars  . Smokeless tobacco: Never Used  Substance Use Topics  . Alcohol use: No  . Drug use: Yes    Types: Marijuana   Allergies   Lactose intolerance (gi)   Review of Systems Review of Systems  Constitutional: Negative.   Genitourinary: Negative.    Physical Exam Triage Vital Signs ED Triage Vitals [08/31/19 1836]  Enc Vitals Group     BP 135/72     Pulse Rate (!) 57     Resp 18     Temp 98.5 F (36.9 C)     Temp Source Oral     SpO2 100 %     Weight 155 lb (70.3 kg)     Height 5\' 7"  (1.702 m)     Head Circumference      Peak Flow      Pain Score 0     Pain Loc      Pain Edu?      Excl. in GC?    Updated Vital Signs BP 135/72 (BP Location: Right Arm)   Pulse (!) 57   Temp 98.5 F (36.9 C) (Oral)   Resp 18   Ht 5\' 7"  (1.702 m)    Wt 70.3 kg   SpO2 100%   BMI 24.28 kg/m   Visual Acuity Right Eye Distance:   Left Eye Distance:   Bilateral Distance:    Right Eye Near:   Left Eye Near:    Bilateral Near:     Physical Exam Vitals signs and nursing note reviewed.  Constitutional:      General: He is not in acute distress.    Appearance: Normal appearance. He is not ill-appearing.  HENT:     Head: Normocephalic and atraumatic.  Eyes:     General:        Right eye: No discharge.        Left eye: No discharge.     Conjunctiva/sclera: Conjunctivae normal.  Cardiovascular:     Rate and Rhythm: Normal rate and regular rhythm.     Heart sounds: No murmur.  Pulmonary:     Effort: Pulmonary effort is normal.     Breath sounds: No wheezing, rhonchi or rales.  Neurological:     Mental Status: He is alert.  Psychiatric:  Mood and Affect: Mood normal.        Behavior: Behavior normal.    UC Treatments / Results  Labs (all labs ordered are listed, but only abnormal results are displayed) Labs Reviewed  GC/CHLAMYDIA PROBE AMP    EKG   Radiology No results found.  Procedures Procedures (including critical care time)  Medications Ordered in UC Medications  cefTRIAXone (ROCEPHIN) injection 250 mg (250 mg Intramuscular Given 08/31/19 1902)  azithromycin (ZITHROMAX) tablet 1,000 mg (1,000 mg Oral Given 08/31/19 1902)    Initial Impression / Assessment and Plan / UC Course  I have reviewed the triage vital signs and the nursing notes.  Pertinent labs & imaging results that were available during my care of the patient were reviewed by me and considered in my medical decision making (see chart for details).    28 year old male presents with exposure to STD.  Awaiting test results.  Patient desires empiric treatment.  Rocephin and azithromycin given today.  Final Clinical Impressions(s) / UC Diagnoses   Final diagnoses:  Exposure to STD   Discharge Instructions   None    ED Prescriptions     None     PDMP not reviewed this encounter.   Coral Spikes, [place] 08/31/19 1936

## 2019-09-03 LAB — GC/CHLAMYDIA PROBE AMP
Chlamydia trachomatis, NAA: NEGATIVE
Neisseria Gonorrhoeae by PCR: NEGATIVE

## 2019-09-15 ENCOUNTER — Encounter: Payer: Self-pay | Admitting: Emergency Medicine

## 2019-09-15 ENCOUNTER — Other Ambulatory Visit: Payer: Self-pay

## 2019-09-15 ENCOUNTER — Ambulatory Visit
Admission: EM | Admit: 2019-09-15 | Discharge: 2019-09-15 | Disposition: A | Discharge status: Home / Self Care | Payer: Self-pay | Attending: Family Medicine | Admitting: Family Medicine

## 2019-09-15 DIAGNOSIS — Z202 Contact with and (suspected) exposure to infections with a predominantly sexual mode of transmission: Secondary | ICD-10-CM | POA: Insufficient documentation

## 2019-09-15 NOTE — Discharge Instructions (Signed)
Call for results next week.  Take care  Dr. Lacinda Axon

## 2019-09-15 NOTE — ED Provider Notes (Signed)
[facility]    CSN: [id number] Arrival date & time: 09/15/19  1007  History   Chief Complaint Chief Complaint  Patient presents with  . SEXUALLY TRANSMITTED DISEASE   HPI  28 year old male presents for STD testing.  Patient recently seen by me on 12/9.  Treated empirically for gonorrhea.  Testing returned negative.  Patient reports that his girlfriend never got treated.  They recently had intercourse.  He is concerned about the possibility of having an STD.  Patient is currently asymptomatic and feeling well.  Desires testing today.  No other complaints.  PMH, Surgical Hx, Family Hx, Social History reviewed and updated as below.  PMH: Hx of tobacco abuse, marijuana use.  Surgical Hx: Adenoidectomy   Home Medications    Prior to Admission medications   Medication Sig Start Date End Date Taking? Authorizing Provider  ibuprofen (ADVIL,MOTRIN) 800 MG tablet Take 1 tablet (800 mg total) by mouth every 8 (eight) hours as needed. 06/02/18   Sherrie Mustache Roselyn Bering, PA-C    Family History Family History  Problem Relation Age of Onset  . Diabetes Mother   . Other Father        unknown medical history    Social History Social History   Tobacco Use  . Smoking status: Former Smoker    Types: Cigars  . Smokeless tobacco: Never Used  Substance Use Topics  . Alcohol use: No  . Drug use: Yes    Frequency: 14.0 times per week    Types: Marijuana    Comment: twice daily     Allergies   Lac bovis and Lactose intolerance (gi)   Review of Systems Review of Systems  Constitutional: Negative.   Genitourinary: Negative.    Physical Exam Triage Vital Signs ED Triage Vitals  Enc Vitals Group     BP 09/15/19 0930 123/77     Pulse Rate 09/15/19 0930 74     Resp 09/15/19 0930 16     Temp 09/15/19 0930 98.2 F (36.8 C)     Temp Source 09/15/19 0930 Oral     SpO2 09/15/19 0930 100 %     Weight 09/15/19 0930 150 lb (68 kg)     Height 09/15/19 0930 5\' 7"  (1.702 m)   Head Circumference --      Peak Flow --      Pain Score 09/15/19 0928 0     Pain Loc --      Pain Edu? --      Excl. in GC? --    Updated Vital Signs BP 123/77 (BP Location: Left Arm)   Pulse 74   Temp 98.2 F (36.8 C) (Oral)   Resp 16   Ht 5\' 7"  (1.702 m)   Wt 68 kg   SpO2 100%   BMI 23.49 kg/m   Visual Acuity Right Eye Distance:   Left Eye Distance:   Bilateral Distance:    Right Eye Near:   Left Eye Near:    Bilateral Near:     Physical Exam Vitals and nursing note reviewed.  Constitutional:      General: He is not in acute distress.    Appearance: Normal appearance. He is not ill-appearing.  HENT:     Head: Normocephalic and atraumatic.  Eyes:     General:        Right eye: No discharge.     Conjunctiva/sclera: Conjunctivae normal.  Cardiovascular:     Rate and Rhythm: Normal rate and regular rhythm.  Heart sounds: No murmur.  Pulmonary:     Effort: Pulmonary effort is normal.     Breath sounds: Normal breath sounds. No wheezing, rhonchi or rales.  Skin:    General: Skin is warm.     Findings: No rash.  Neurological:     Mental Status: He is alert.  Psychiatric:        Mood and Affect: Mood normal.        Behavior: Behavior normal.    UC Treatments / Results  Labs (all labs ordered are listed, but only abnormal results are displayed) Labs Reviewed  GC/CHLAMYDIA PROBE AMP  TRICHOMONAS VAGINALIS, PROBE AMP    EKG   Radiology No results found.  Procedures Procedures (including critical care time)  Medications Ordered in UC Medications - No data to display  Initial Impression / Assessment and Plan / UC Course  I have reviewed the triage vital signs and the nursing notes.  Pertinent labs & imaging results that were available during my care of the patient were reviewed by me and considered in my medical decision making (see chart for details).    28 year old male presents with STD exposure.  He is currently asymptomatic.  Awaiting  testing results.  Final Clinical Impressions(s) / UC Diagnoses   Final diagnoses:  STD exposure     Discharge Instructions     Call for results next week.  Take care  Dr. Lacinda Axon    ED Prescriptions    None     PDMP not reviewed this encounter.   Coral Spikes, [place] 09/15/19 1008

## 2019-09-15 NOTE — ED Triage Notes (Signed)
Patient in today for STD testing. Patient states that he was seen and treated on 08/31/19 and told his girlfriend to get tested and treated. Girlfriend got tested, but never got treated. She was positive for trichomonas and gonorrhea. Patient states he didn't know that she didn't get treated and was re-exposed 3-4 days ago. Patient denies any symptoms at this time.

## 2019-09-18 LAB — GC/CHLAMYDIA PROBE AMP
Chlamydia trachomatis, NAA: NEGATIVE
Neisseria Gonorrhoeae by PCR: NEGATIVE

## 2019-09-18 LAB — TRICHOMONAS VAGINALIS, PROBE AMP: Trich vag by NAA: NEGATIVE

## 2019-10-17 ENCOUNTER — Other Ambulatory Visit: Payer: Self-pay

## 2019-10-17 ENCOUNTER — Emergency Department
Admission: EM | Admit: 2019-10-17 | Discharge: 2019-10-17 | Disposition: A | Discharge status: Home / Self Care | Payer: Self-pay | Attending: Emergency Medicine | Admitting: Emergency Medicine

## 2019-10-17 ENCOUNTER — Encounter: Payer: Self-pay | Admitting: Emergency Medicine

## 2019-10-17 DIAGNOSIS — Z87891 Personal history of nicotine dependence: Secondary | ICD-10-CM | POA: Insufficient documentation

## 2019-10-17 DIAGNOSIS — Y999 Unspecified external cause status: Secondary | ICD-10-CM | POA: Insufficient documentation

## 2019-10-17 DIAGNOSIS — Y939 Activity, unspecified: Secondary | ICD-10-CM | POA: Insufficient documentation

## 2019-10-17 DIAGNOSIS — S39012A Strain of muscle, fascia and tendon of lower back, initial encounter: Secondary | ICD-10-CM | POA: Insufficient documentation

## 2019-10-17 DIAGNOSIS — Y929 Unspecified place or not applicable: Secondary | ICD-10-CM | POA: Insufficient documentation

## 2019-10-17 DIAGNOSIS — X58XXXA Exposure to other specified factors, initial encounter: Secondary | ICD-10-CM | POA: Insufficient documentation

## 2019-10-17 MED ORDER — ORPHENADRINE CITRATE 30 MG/ML IJ SOLN
60.0000 mg | Freq: Two times a day (BID) | INTRAMUSCULAR | Status: DC
  Administered 2019-10-17: 60 mg via INTRAMUSCULAR
  Filled 2019-10-17: qty 2

## 2019-10-17 MED ORDER — KETOROLAC TROMETHAMINE 60 MG/2ML IM SOLN
60.0000 mg | Freq: Once | INTRAMUSCULAR | Status: AC
  Administered 2019-10-17: 60 mg via INTRAMUSCULAR
  Filled 2019-10-17: qty 2

## 2019-10-17 MED ORDER — KETOROLAC TROMETHAMINE 10 MG PO TABS: 10.0000 mg | ORAL_TABLET | Freq: Four times a day (QID) | ORAL | 0 refills | Status: DC | PRN

## 2019-10-17 MED ORDER — METHOCARBAMOL 750 MG PO TABS: 750.0000 mg | ORAL_TABLET | Freq: Four times a day (QID) | ORAL | 0 refills | Status: DC

## 2019-10-17 NOTE — ED Triage Notes (Signed)
Patient presents to the ED with lower back pain x 2 days that has been increasing in severity.  Patient states today he had to "roll" out of the bed.  Patient denies any known injury to his back.  Denies history of back problems.

## 2019-10-17 NOTE — ED Provider Notes (Signed)
[facility] Emergency Department Provider Note   ____________________________________________   First MD Initiated Contact with Patient 10/17/19 [phone]     (approximate)  I have reviewed the triage vital signs and the nursing notes.   HISTORY  Chief Complaint Back Pain    HPI Charles Whitehead is a 29 y.o. male patient presents with 2 days right lateral lower back pain.  Patient denies specific provocative incident.  Patient denies radicular component to his back pain.  Patient denies bladder bowel dysfunction.  Patient rates pain as 8/10.  Patient described pain as "achy/tightness".  No palliative measure for complaint.         History reviewed. No pertinent past medical history.  There are no problems to display for this patient.   Past Surgical History:  Procedure Laterality Date  . NO PAST SURGERIES      Prior to Admission medications   Medication Sig Start Date End Date Taking? Authorizing Provider  ketorolac (TORADOL) 10 MG tablet Take 1 tablet (10 mg total) by mouth every 6 (six) hours as needed. 10/17/19   Sable Feil, PA-C  methocarbamol (ROBAXIN-750) 750 MG tablet Take 1 tablet (750 mg total) by mouth 4 (four) times daily. 10/17/19   Sable Feil, PA-C    Allergies Lac bovis and Lactose intolerance (gi)  Family History  Problem Relation Age of Onset  . Diabetes Mother   . Other Father        unknown medical history    Social History Social History   Tobacco Use  . Smoking status: Former Smoker    Types: Cigars  . Smokeless tobacco: Never Used  Substance Use Topics  . Alcohol use: No  . Drug use: Yes    Frequency: 14.0 times per week    Types: Marijuana    Comment: twice daily    Review of Systems Constitutional: No fever/chills Eyes: No visual changes. ENT: No sore throat. Cardiovascular: Denies chest pain. Respiratory: Denies shortness of breath. Gastrointestinal: No abdominal pain.  No nausea, no vomiting.   No diarrhea.  No constipation. Genitourinary: Negative for dysuria. Musculoskeletal: Negative for back pain. Skin: Negative for rash. Neurological: Negative for headaches, focal weakness or numbness. Allergic/Immunilogical: Lactose ____________________________________________   PHYSICAL EXAM:  VITAL SIGNS: ED Triage Vitals  Enc Vitals Group     BP 10/17/19 0757 128/86     Pulse Rate 10/17/19 0757 91     Resp 10/17/19 0757 18     Temp 10/17/19 0757 98.2 F (36.8 C)     Temp Source 10/17/19 0757 Oral     SpO2 10/17/19 0757 98 %     Weight 10/17/19 0757 145 lb (65.8 kg)     Height 10/17/19 0757 5\' 7"  (1.702 m)     Head Circumference --      Peak Flow --      Pain Score 10/17/19 0756 8     Pain Loc --      Pain Edu? --      Excl. in [place]? --    Constitutional: Alert and oriented. Well appearing and in no acute distress. Cardiovascular: Normal rate, regular rhythm. Grossly normal heart sounds.  Good peripheral circulation. Respiratory: Normal respiratory effort.  No retractions. Lungs CTAB. Gastrointestinal: Soft and nontender. No distention. No abdominal bruits. No CVA tenderness. Musculoskeletal: No obvious spinal deformity.  Patient decreased range of motion with left lateral movements.  Patient has moderate guarding palpation of the right paraspinal muscle area.  Patient negative  straight leg test in the supine position. neurologic:  Normal speech and language. No gross focal neurologic deficits are appreciated. No gait instability. Skin:  Skin is warm, dry and intact. No rash noted. Psychiatric: Mood and affect are normal. Speech and behavior are normal.  ____________________________________________   LABS (all labs ordered are listed, but only abnormal results are displayed)  Labs Reviewed - No data to display ____________________________________________  EKG   ____________________________________________  RADIOLOGY  ED MD interpretation:    Official radiology  report(s): No results found.  ____________________________________________   PROCEDURES  Procedure(s) performed (including Critical Care):  Procedures   ____________________________________________   INITIAL IMPRESSION / ASSESSMENT AND PLAN / ED COURSE  As part of my medical decision making, I reviewed the following data within the electronic MEDICAL RECORD NUMBER     Patient presents with 2 days of right low back pain.  No radicular component to her back pain.  No bladder bowel dysfunction.  Patient physical exam is consistent with right paraspinal muscle strain.  Patient given discharge care instruction take medication as directed.  Patient advised [facility].    Charles Whitehead was evaluated in Emergency Department on 10/17/2019 for the symptoms described in the history of present illness. He was evaluated in the context of the global COVID-19 pandemic, which necessitated consideration that the patient might be at risk for infection with the SARS-CoV-2 virus that causes COVID-19. Institutional protocols and algorithms that pertain to the evaluation of patients at risk for COVID-19 are in a state of rapid change based on information released by regulatory bodies including the CDC and federal and state organizations. These policies and algorithms were followed during the patient's care in the ED.       ____________________________________________   FINAL CLINICAL IMPRESSION(S) / ED DIAGNOSES  Final diagnoses:  Strain of lumbar region, initial encounter     ED Discharge Orders         Ordered    ketorolac (TORADOL) 10 MG tablet  Every 6 hours PRN     10/17/19 0813    methocarbamol (ROBAXIN-750) 750 MG tablet  4 times daily     10/17/19 0813           Note:  This document was prepared using Dragon voice recognition software and may include unintentional dictation errors.    Joni Reining, PA-C 10/17/19 0815    Chesley Noon, MD 10/18/19  [phone]

## 2019-10-17 NOTE — Discharge Instructions (Addendum)
Follow discharge care instruction take medication as directed.  Be advised withdraws effects of the muscle relaxer.

## 2019-10-17 NOTE — ED Notes (Signed)
See triage note   Presents with lower back pain for the past couple of days  Denies any recent injury  Ambulates well to treatment room

## 2019-11-04 ENCOUNTER — Encounter: Payer: Self-pay | Admitting: Emergency Medicine

## 2019-11-04 ENCOUNTER — Ambulatory Visit
Admission: EM | Admit: 2019-11-04 | Discharge: 2019-11-04 | Disposition: A | Discharge status: Home / Self Care | Payer: Self-pay | Attending: Emergency Medicine | Admitting: Emergency Medicine

## 2019-11-04 ENCOUNTER — Other Ambulatory Visit: Payer: Self-pay

## 2019-11-04 DIAGNOSIS — Z202 Contact with and (suspected) exposure to infections with a predominantly sexual mode of transmission: Secondary | ICD-10-CM

## 2019-11-04 LAB — CHLAMYDIA/NGC RT PCR (ARMC ONLY): Chlamydia Tr: NOT DETECTED

## 2019-11-04 LAB — CHLAMYDIA/NGC RT PCR (ARMC ONLY)??????????: N gonorrhoeae: NOT DETECTED

## 2019-11-04 NOTE — ED Provider Notes (Signed)
[facility]    CSN: [id number] Arrival date & time: 11/04/19  0865      History   Chief Complaint Chief Complaint  Patient presents with  . STD Test    HPI Charles Whitehead is a 29 y.o. male.   HPI  29 year old male presents with possible exposure for STD.  He said he has had no symptoms.  He states that he gets things in his mind and is very worried that he may have contracted a STD.  Was initially diagnosed with an STD at 29 years of age we was treated appropriately.  States that his last with intercourse was approximately week and 1/2 to 2 weeks ago.  He did use a condom at that time.  I reviewed his previous encounters from our facility and also outside facilities.  At each time it appears that his STDs have always been negative.  He relates the joint time he has been treated for an STD was when he contracted gonorrhea and chlamydia at the age of 76        History reviewed. No pertinent past medical history.  There are no problems to display for this patient.   Past Surgical History:  Procedure Laterality Date  . NO PAST SURGERIES         Home Medications    Prior to Admission medications   Not on File    Family History Family History  Problem Relation Age of Onset  . Diabetes Mother   . Other Father        unknown medical history    Social History Social History   Tobacco Use  . Smoking status: Former Smoker    Types: Cigars  . Smokeless tobacco: Never Used  Substance Use Topics  . Alcohol use: No  . Drug use: Yes    Frequency: 14.0 times per week    Types: Marijuana    Comment: twice daily     Allergies   Lac bovis and Lactose intolerance (gi)   Review of Systems Review of Systems  Constitutional: Negative for activity change, appetite change, chills, diaphoresis, fatigue and fever.  Genitourinary: Negative for difficulty urinating, discharge, dysuria, frequency, genital sores, penile pain, penile swelling, scrotal  swelling, testicular pain and urgency.  All other systems reviewed and are negative.    Physical Exam Triage Vital Signs ED Triage Vitals  Enc Vitals Group     BP 11/04/19 0942 127/74     Pulse Rate 11/04/19 0942 61     Resp 11/04/19 0942 16     Temp 11/04/19 0942 98 F (36.7 C)     Temp Source 11/04/19 0942 Oral     SpO2 11/04/19 0942 97 %     Weight 11/04/19 0939 145 lb (65.8 kg)     Height 11/04/19 0939 5\' 7"  (1.702 m)     Head Circumference --      Peak Flow --      Pain Score 11/04/19 0939 0     Pain Loc --      Pain Edu? --      Excl. in [place]? --    No data found.  Updated Vital Signs BP 127/74 (BP Location: Right Arm)   Pulse 61   Temp 98 F (36.7 C) (Oral)   Resp 16   Ht 5\' 7"  (1.702 m)   Wt 145 lb (65.8 kg)   SpO2 97%   BMI 22.71 kg/m   Visual Acuity Right Eye Distance:  Left Eye Distance:   Bilateral Distance:    Right Eye Near:   Left Eye Near:    Bilateral Near:     Physical Exam Vitals and nursing note reviewed.  Constitutional:      General: He is not in acute distress.    Appearance: Normal appearance. He is normal weight. He is not toxic-appearing.  HENT:     Head: Normocephalic and atraumatic.  Eyes:     Conjunctiva/sclera: Conjunctivae normal.  Cardiovascular:     Rate and Rhythm: Normal rate and regular rhythm.     Heart sounds: Normal heart sounds.  Pulmonary:     Effort: Pulmonary effort is normal.     Breath sounds: Normal breath sounds.  Abdominal:     Tenderness: There is no right CVA tenderness or left CVA tenderness.  Musculoskeletal:        General: Normal range of motion.     Cervical back: Normal range of motion and neck supple.  Skin:    General: Skin is warm and dry.  Neurological:     General: No focal deficit present.     Mental Status: He is alert and oriented to person, place, and time.  Psychiatric:        Mood and Affect: Mood normal.        Behavior: Behavior normal.        Thought Content: Thought  content normal.        Judgment: Judgment normal.      UC Treatments / Results  Labs (all labs ordered are listed, but only abnormal results are displayed) Labs Reviewed  CHLAMYDIA/NGC RT PCR [facility] ONLY)    EKG   Radiology No results found.  Procedures Procedures (including critical care time)  Medications Ordered in UC Medications - No data to display  Initial Impression / Assessment and Plan / UC Course  I have reviewed the triage vital signs and the nursing notes.  Pertinent labs & imaging results that were available during my care of the patient were reviewed by me and considered in my medical decision making (see chart for details).   29 year old asymptomatic male presents after possible exposure to STDs.  I have reviewed his previous visits to our facility and others for very similar presentations.  He has not had any positive findings for STDs but he also is very anxious of the possibility of having an STD.  We have discussed empiric treatment which I have recommended against because of his previous visits as well has his use of condoms at the present time.  We have discussed  safe sexual practices.  Chlamydia and GC samples were submitted for nucleic acid amplification.  He will keep a watch on the MyChart for results.  He was told that he will be called if they are positive.   Final Clinical Impressions(s) / UC Diagnoses   Final diagnoses:  Possible exposure to STD   Discharge Instructions   None    ED Prescriptions    None     PDMP not reviewed this encounter.   Lorin Picket, PA-C 11/04/19 1021

## 2019-11-04 NOTE — ED Triage Notes (Signed)
Patient states that he would like to be tested for STD.  Patient states that he has no symptoms.  Patient states that he is concern that he will have a STD.  Patient states that he has not been diagnosed with a STD since he was 29 years old.

## 2019-12-05 ENCOUNTER — Encounter: Payer: Self-pay | Admitting: Emergency Medicine

## 2019-12-05 ENCOUNTER — Other Ambulatory Visit: Payer: Self-pay

## 2019-12-05 ENCOUNTER — Ambulatory Visit
Admission: EM | Admit: 2019-12-05 | Discharge: 2019-12-05 | Disposition: A | Discharge status: Home / Self Care | Payer: Self-pay | Attending: Family Medicine | Admitting: Family Medicine

## 2019-12-05 DIAGNOSIS — R369 Urethral discharge, unspecified: Secondary | ICD-10-CM | POA: Insufficient documentation

## 2019-12-05 LAB — URINALYSIS, COMPLETE (UACMP) WITH MICROSCOPIC
Bacteria, UA: NONE SEEN
Bilirubin Urine: NEGATIVE
Glucose, UA: NEGATIVE mg/dL
Hgb urine dipstick: NEGATIVE
Leukocytes,Ua: NEGATIVE
Nitrite: NEGATIVE
Protein, ur: NEGATIVE mg/dL
Specific Gravity, Urine: 1.025 (ref 1.005–1.030)
Squamous Epithelial / LPF: NONE SEEN (ref 0–5)
pH: 7 (ref 5.0–8.0)

## 2019-12-05 LAB — CHLAMYDIA/NGC RT PCR (ARMC ONLY)
Chlamydia Tr: DETECTED — AB
N gonorrhoeae: NOT DETECTED

## 2019-12-05 LAB — CHLAMYDIA/NGC RT PCR (ARMC ONLY)??????????

## 2019-12-05 MED ORDER — CEFTRIAXONE SODIUM 500 MG IJ SOLR
500.0000 mg | Freq: Once | INTRAMUSCULAR | Status: AC
  Administered 2019-12-05: 20:00:00 500 mg via INTRAMUSCULAR

## 2019-12-05 MED ORDER — DOXYCYCLINE HYCLATE 100 MG PO CAPS: 100.0000 mg | ORAL_CAPSULE | Freq: Two times a day (BID) | ORAL | 0 refills | Status: DC

## 2019-12-05 NOTE — ED Triage Notes (Addendum)
Patient here for STD testing. He states he is having penile discharge x 3-4 days. No other symptoms. No known exposure that he is aware of.

## 2019-12-05 NOTE — Discharge Instructions (Signed)
Awaiting results.  Medication as directed.  Safe sex.  Take care  Dr. Adriana Simas

## 2019-12-05 NOTE — ED Provider Notes (Signed)
[facility]    CSN: [id number] Arrival date & time: 12/05/19  [place]   History   Chief Complaint Chief Complaint  Patient presents with  . Exposure to STD   HPI  29 year old male presents with penile discharge.  Patient reports white discharge over the past 3 to 4 days.  No known STD exposure.  Reports 1 sexual partner.  He is having unprotected intercourse.  He has been seen multiple times for penile discharge/STD exposure.  Desires testing and empiric treatment today. No other complaints or concerns at this am.  Past Surgical History:  Procedure Laterality Date  . NO PAST SURGERIES     Home Medications    Prior to Admission medications   Medication Sig Start Date End Date Taking? Authorizing Provider  doxycycline (VIBRAMYCIN) 100 MG capsule Take 1 capsule (100 mg total) by mouth 2 (two) times daily. 12/05/19   Coral Spikes, DO    Family History Family History  Problem Relation Age of Onset  . Diabetes Mother   . Other Father        unknown medical history    Social History Social History   Tobacco Use  . Smoking status: Former Smoker    Types: Cigars  . Smokeless tobacco: Never Used  Substance Use Topics  . Alcohol use: No  . Drug use: Yes    Frequency: 14.0 times per week    Types: Marijuana    Comment: twice daily     Allergies   Lac bovis and Lactose intolerance (gi)   Review of Systems Review of Systems  Constitutional: Negative.   Genitourinary: Positive for discharge.   Physical Exam Triage Vital Signs ED Triage Vitals  Enc Vitals Group     BP 12/05/19 1858 131/70     Pulse Rate 12/05/19 1858 89     Resp 12/05/19 1858 18     Temp 12/05/19 1858 98.5 F (36.9 C)     Temp Source 12/05/19 1858 Oral     SpO2 12/05/19 1858 100 %     Weight 12/05/19 1856 150 lb (68 kg)     Height 12/05/19 1856 5\' 7"  (1.702 m)     Head Circumference --      Peak Flow --      Pain Score 12/05/19 1856 0     Pain Loc --      Pain Edu? --    Excl. in [place]? --    Updated Vital Signs BP 131/70 (BP Location: Right Arm)   Pulse 89   Temp 98.5 F (36.9 C) (Oral)   Resp 18   Ht 5\' 7"  (1.702 m)   Wt 68 kg   SpO2 100%   BMI 23.49 kg/m   Visual Acuity Right Eye Distance:   Left Eye Distance:   Bilateral Distance:    Right Eye Near:   Left Eye Near:    Bilateral Near:     Physical Exam Vitals and nursing note reviewed.  Constitutional:      General: He is not in acute distress.    Appearance: Normal appearance. He is not ill-appearing.  HENT:     Head: Normocephalic and atraumatic.  Eyes:     General:        Right eye: No discharge.        Left eye: No discharge.     Conjunctiva/sclera: Conjunctivae normal.  Pulmonary:     Effort: Pulmonary effort is normal. No respiratory distress.  Abdominal:  Hernia: There is no hernia in the left inguinal area or right inguinal area.  Genitourinary:    Penis: Circumcised. Discharge present.   Neurological:     Mental Status: He is alert.  Psychiatric:        Mood and Affect: Mood normal.        Behavior: Behavior normal.    UC Treatments / Results  Labs (all labs ordered are listed, but only abnormal results are displayed) Labs Reviewed  URINALYSIS, COMPLETE (UACMP) WITH MICROSCOPIC - Abnormal; Notable for the following components:      Result Value   Ketones, ur TRACE (*)    All other components within normal limits  CHLAMYDIA/NGC RT PCR ([facility] ONLY)  TRICHOMONAS VAGINALIS, PROBE AMP  MISC [facility] TEST (SEND OUT)    EKG   Radiology No results found.  Procedures Procedures (including critical care time)  Medications Ordered in UC Medications  cefTRIAXone (ROCEPHIN) injection 500 mg (500 mg Intramuscular Given 12/05/19 1930)    Initial Impression / Assessment and Plan / UC Course  I have reviewed the triage vital signs and the nursing notes.  Pertinent labs & imaging results that were available during my care of the patient were reviewed by  me and considered in my medical decision making (see chart for details).    29 year old male presents with penile discharge.  Patient requesting empiric treatment.  Ceftriaxone given here.  Sending home on doxycycline.  Awaiting test results.  Final Clinical Impressions(s) / UC Diagnoses   Final diagnoses:  Penile discharge     Discharge Instructions     Awaiting results.  Medication as directed.  Safe sex.  Take care  Dr. Adriana Simas    ED Prescriptions    Medication Sig Dispense Auth. Provider   doxycycline (VIBRAMYCIN) 100 MG capsule Take 1 capsule (100 mg total) by mouth 2 (two) times daily. 14 capsule Everlene Other G, DO     PDMP not reviewed this encounter.   Tommie Sams, [place] 12/05/19 1947

## 2019-12-08 LAB — TRICHOMONAS VAGINALIS, PROBE AMP: Trich vag by NAA: NEGATIVE

## 2019-12-27 DIAGNOSIS — Z113 Encounter for screening for infections with a predominantly sexual mode of transmission: Secondary | ICD-10-CM | POA: Insufficient documentation

## 2019-12-27 DIAGNOSIS — A749 Chlamydial infection, unspecified: Secondary | ICD-10-CM | POA: Insufficient documentation

## 2020-02-15 ENCOUNTER — Encounter: Payer: Self-pay | Admitting: Emergency Medicine

## 2020-02-15 ENCOUNTER — Ambulatory Visit
Admission: EM | Admit: 2020-02-15 | Discharge: 2020-02-15 | Disposition: A | Discharge status: Home / Self Care | Payer: Self-pay | Attending: Family Medicine | Admitting: Family Medicine

## 2020-02-15 ENCOUNTER — Ambulatory Visit (INDEPENDENT_AMBULATORY_CARE_PROVIDER_SITE_OTHER): Payer: Self-pay

## 2020-02-15 DIAGNOSIS — S60111A Contusion of right thumb with damage to nail, initial encounter: Secondary | ICD-10-CM

## 2020-02-15 DIAGNOSIS — M79644 Pain in right finger(s): Secondary | ICD-10-CM

## 2020-02-15 MED ORDER — KETOROLAC TROMETHAMINE 10 MG PO TABS: 10.0000 mg | ORAL_TABLET | Freq: Four times a day (QID) | ORAL | 0 refills | Status: DC | PRN

## 2020-02-15 NOTE — ED Provider Notes (Signed)
[facility]    CSN: [id number] Arrival date & time: 02/15/20  1520      History   Chief Complaint Chief Complaint  Patient presents with  . Hand Pain   HPI  29 year old male presents with a right thumb injury.  Patient states he slammed his right thumb in a car door last night.  He reports severe pain diffusely over the thumb.  Pain 10/10 in severity.  He has been icing the area without relief.  Exacerbated by movement and activity.  No relieving factors.  No apparent bruising.  No apparent swelling.  No other associated symptoms.  No other complaints.  Past Surgical History:  Procedure Laterality Date  . NO PAST SURGERIES      Home Medications    Prior to Admission medications   Medication Sig Start Date End Date Taking? Authorizing Provider  ketorolac (TORADOL) 10 MG tablet Take 1 tablet (10 mg total) by mouth every 6 (six) hours as needed for moderate pain or severe pain. 02/15/20   Coral Spikes, DO    Family History Family History  Problem Relation Age of Onset  . Diabetes Mother   . Other Father        unknown medical history    Social History Social History   Tobacco Use  . Smoking status: Former Smoker    Types: Cigars  . Smokeless tobacco: Never Used  Substance Use Topics  . Alcohol use: No  . Drug use: Yes    Frequency: 14.0 times per week    Types: Marijuana    Comment: twice daily     Allergies   Lac bovis and Lactose intolerance (gi)   Review of Systems Review of Systems  Constitutional: Negative.   Musculoskeletal:       Right thumb injury, pain.   Physical Exam Triage Vital Signs ED Triage Vitals  Enc Vitals Group     BP 02/15/20 1552 125/79     Pulse Rate 02/15/20 1552 (!) 56     Resp 02/15/20 1552 18     Temp 02/15/20 1552 98.2 F (36.8 C)     Temp Source 02/15/20 1552 Oral     SpO2 02/15/20 1552 100 %     Weight 02/15/20 1551 150 lb (68 kg)     Height 02/15/20 1551 5\' 7"  (1.702 m)     Head Circumference --        Peak Flow --      Pain Score 02/15/20 1551 10     Pain Loc --      Pain Edu? --      Excl. in [place]? --    Updated Vital Signs BP 125/79 (BP Location: Right Arm)   Pulse (!) 56   Temp 98.2 F (36.8 C) (Oral)   Resp 18   Ht 5\' 7"  (1.702 m)   Wt 68 kg   SpO2 100%   BMI 23.49 kg/m   Visual Acuity Right Eye Distance:   Left Eye Distance:   Bilateral Distance:    Right Eye Near:   Left Eye Near:    Bilateral Near:     Physical Exam Vitals and nursing note reviewed.  Constitutional:      General: He is not in acute distress.    Appearance: Normal appearance. He is not ill-appearing.  HENT:     Head: Normocephalic and atraumatic.  Eyes:     General:        Right eye: No discharge.  Left eye: No discharge.     Conjunctiva/sclera: Conjunctivae normal.  Pulmonary:     Effort: Pulmonary effort is normal. No respiratory distress.  Musculoskeletal:     Comments: Right thumb -diffusely tenderness.  No appreciable swelling.  Small subungual hematoma noted.  Neurological:     Mental Status: He is alert.  Psychiatric:        Mood and Affect: Mood normal.        Behavior: Behavior normal.    UC Treatments / Results  Labs (all labs ordered are listed, but only abnormal results are displayed) Labs Reviewed - No data to display  EKG   Radiology DG Finger Thumb Right  Result Date: 02/15/2020 CLINICAL DATA:  Patient states he closed his right thumb in the car door last night. EXAM: RIGHT THUMB 2+V COMPARISON:  Right hand radiographs 06/02/2018 FINDINGS: There is no evidence of fracture or dislocation. There is no evidence of arthropathy or other focal bone abnormality. Soft tissues are unremarkable. IMPRESSION: Negative radiographs of the right thumb. Electronically Signed   By: Emmaline Kluver M.D.   On: 02/15/2020 16:08    Procedures Procedures (including critical care time)  Medications Ordered in UC Medications - No data to display  Initial Impression /  Assessment and Plan / UC Course  I have reviewed the triage vital signs and the nursing notes.  Pertinent labs & imaging results that were available during my care of the patient were reviewed by me and considered in my medical decision making (see chart for details).    29 year old male presents with right thumb pain.  Patient suffered an injury.  Has a small subungual hematoma.  X-ray obtained and independently reviewed by me.  Interpretation: No acute fracture.  Toradol as needed for pain.  Rest, ice.  Supportive care.   Final Clinical Impressions(s) / UC Diagnoses   Final diagnoses:  Thumb pain, right  Subungual hematoma of right thumb, initial encounter     Discharge Instructions     Rest.  Continue icing.  Medication as needed for pain.  Take care  Dr. Adriana Simas    ED Prescriptions    Medication Sig Dispense Auth. Provider   ketorolac (TORADOL) 10 MG tablet Take 1 tablet (10 mg total) by mouth every 6 (six) hours as needed for moderate pain or severe pain. 20 tablet Tommie Sams, DO     PDMP not reviewed this encounter.   Tommie Sams, [place] 02/15/20 1637

## 2020-02-15 NOTE — Discharge Instructions (Signed)
Rest.  Continue icing.  Medication as needed for pain.  Take care  Dr. Adriana Simas

## 2020-02-15 NOTE — ED Triage Notes (Signed)
Patient states he closed his right thumb in the car door last night.

## 2020-03-08 ENCOUNTER — Telehealth: Payer: Self-pay | Admitting: General Practice

## 2020-03-08 NOTE — Telephone Encounter (Signed)
Pt was called 3 times and mailed an application 12/15/2019. The last and final call could not be completed.

## 2020-03-20 ENCOUNTER — Telehealth: Payer: Self-pay | Admitting: General Practice

## 2020-03-20 NOTE — Telephone Encounter (Signed)
Individual has been contacted 3+ times regarding ED referral and has been given information regarding how to become a pt. No further attempts will be made to contact individual. 

## 2020-07-09 DIAGNOSIS — Z202 Contact with and (suspected) exposure to infections with a predominantly sexual mode of transmission: Secondary | ICD-10-CM | POA: Insufficient documentation

## 2021-08-06 ENCOUNTER — Encounter: Payer: Self-pay | Admitting: Emergency Medicine

## 2021-08-06 ENCOUNTER — Ambulatory Visit
Admission: EM | Admit: 2021-08-06 | Discharge: 2021-08-06 | Disposition: A | Discharge status: Home / Self Care | Payer: No Typology Code available for payment source

## 2021-08-06 ENCOUNTER — Other Ambulatory Visit: Payer: Self-pay

## 2021-08-06 NOTE — ED Triage Notes (Signed)
Cough, headache, sore throat and body aches x 2-3 days

## 2021-10-07 ENCOUNTER — Other Ambulatory Visit: Payer: Self-pay

## 2021-10-07 ENCOUNTER — Ambulatory Visit
Admission: EM | Admit: 2021-10-07 | Discharge: 2021-10-07 | Disposition: A | Discharge status: Home / Self Care | Payer: No Typology Code available for payment source | Attending: Emergency Medicine | Admitting: Emergency Medicine

## 2021-10-07 DIAGNOSIS — A084 Viral intestinal infection, unspecified: Secondary | ICD-10-CM | POA: Diagnosis not present

## 2021-10-07 MED ORDER — ONDANSETRON 4 MG PO TBDP: 4.0000 mg | ORAL_TABLET | Freq: Three times a day (TID) | ORAL | 0 refills | Status: DC | PRN

## 2021-10-07 MED ORDER — ACETAMINOPHEN 500 MG PO TABS
1000.0000 mg | ORAL_TABLET | Freq: Once | ORAL | Status: AC
  Administered 2021-10-07: 1000 mg via ORAL

## 2021-10-07 MED ORDER — ONDANSETRON 4 MG PO TBDP
4.0000 mg | ORAL_TABLET | Freq: Once | ORAL | Status: AC
  Administered 2021-10-07: 4 mg via ORAL

## 2021-10-07 MED ORDER — ACETAMINOPHEN 325 MG PO TABS
650.0000 mg | ORAL_TABLET | Freq: Once | ORAL | Status: DC
Start: 2021-10-07 — End: 2021-10-07

## 2021-10-07 NOTE — ED Triage Notes (Signed)
Pt c/o vomiting and diarrhea x1day. Pt states that he has been vomiting since 9am this morning and he can hold no food down. Pt states that his symptoms started today.

## 2021-10-07 NOTE — Discharge Instructions (Signed)
Your symptoms are most likely caused by a virus, it will work its way out your system over the next few days  You can use zofran every 8 hours as needed for nausea, be mindful this medication may make you drowsy, take the first dose at home to see how it affects your body  You can use Imodium  to help with diarrhea, and be mindful over use of this medication may cause opposite effect constipation  You can use over-the-counter ibuprofen or Tylenol, which ever you have at home, to help manage fevers  Continue to promote hydration throughout the day by using electrolyte replacement solution such as Gatorade, body armor, Pedialyte, which ever you have at home  Try eating bland foods such as bread, rice, toast, fruit which are easier on the stomach to digest, avoid foods that are overly spicy, overly seasoned or greasy  

## 2021-10-07 NOTE — ED Provider Notes (Signed)
[facility]    CSN: [id number] Arrival date & time: 10/07/21  1842      History   Chief Complaint Chief Complaint  Patient presents with   Emesis   Diarrhea    HPI Charles Whitehead is a 31 y.o. male.   Presents with fever, persistent vomiting and diarrhea beginning this morning.  Unable to tolerate food or liquids.  Has not attempted treatment of symptoms.  No known sick contacts.  No pertinent medical history.  Denies fever, chills, body aches, abdominal pain, URI symptoms, dizziness, lightheadedness, weakness.  Denies dietary changes, consumption of raw foods or recent travel.  History reviewed. No pertinent past medical history.  There are no problems to display for this patient.   Past Surgical History:  Procedure Laterality Date   NO PAST SURGERIES         Home Medications    Prior to Admission medications   Medication Sig Start Date End Date Taking? Authorizing Provider  ketorolac (TORADOL) 10 MG tablet Take 1 tablet (10 mg total) by mouth every 6 (six) hours as needed for moderate pain or severe pain. 02/15/20  Yes Tommie Sams, DO    Family History Family History  Problem Relation Age of Onset   Diabetes Mother    Other Father        unknown medical history    Social History Social History   Tobacco Use   Smoking status: Former    Types: Cigars   Smokeless tobacco: Never  Building services engineer Use: Never used  Substance Use Topics   Alcohol use: No   Drug use: Yes    Frequency: 14.0 times per week    Types: Marijuana    Comment: twice daily     Allergies   Lac bovis and Lactose intolerance (gi)   Review of Systems Review of Systems  Constitutional:  Positive for fever. Negative for activity change, appetite change, chills, diaphoresis, fatigue and unexpected weight change.  HENT: Negative.    Respiratory: Negative.    Cardiovascular: Negative.   Gastrointestinal:  Positive for diarrhea and vomiting. Negative for abdominal  distention, abdominal pain, anal bleeding, blood in stool, constipation, nausea and rectal pain.  Skin: Negative.   Neurological: Negative.     Physical Exam Triage Vital Signs ED Triage Vitals  Enc Vitals Group     BP 10/07/21 1855 132/66     Pulse Rate 10/07/21 1855 (!) 106     Resp 10/07/21 1855 18     Temp 10/07/21 1855 (!) 102.6 F (39.2 C)     Temp Source 10/07/21 1855 Oral     SpO2 10/07/21 1855 99 %     Weight 10/07/21 1853 150 lb (68 kg)     Height 10/07/21 1853 5\' 7"  (1.702 m)     Head Circumference --      Peak Flow --      Pain Score 10/07/21 1853 0     Pain Loc --      Pain Edu? --      Excl. in [place]? --    No data found.  Updated Vital Signs BP 132/66 (BP Location: Left Arm)    Pulse (!) 106    Temp (!) 102.6 F (39.2 C) (Oral)    Resp 18    Ht 5\' 7"  (1.702 m)    Wt 150 lb (68 kg)    SpO2 99%    BMI 23.49 kg/m   Visual Acuity Right Eye  Distance:   Left Eye Distance:   Bilateral Distance:    Right Eye Near:   Left Eye Near:    Bilateral Near:     Physical Exam Constitutional:      Appearance: Normal appearance.  HENT:     Head: Normocephalic.  Eyes:     Extraocular Movements: Extraocular movements intact.  Pulmonary:     Effort: Pulmonary effort is normal.  Abdominal:     General: Abdomen is flat. Bowel sounds are normal. There is no distension.     Palpations: Abdomen is soft.     Tenderness: There is no abdominal tenderness. There is no guarding.  Skin:    General: Skin is warm and dry.  Neurological:     Mental Status: He is alert and oriented to person, place, and time. Mental status is at baseline.  Psychiatric:        Mood and Affect: Mood normal.        Behavior: Behavior normal.     UC Treatments / Results  Labs (all labs ordered are listed, but only abnormal results are displayed) Labs Reviewed - No data to display  EKG   Radiology No results found.  Procedures Procedures (including critical care time)  Medications  Ordered in UC Medications - No data to display  Initial Impression / Assessment and Plan / UC Course  I have reviewed the triage vital signs and the nursing notes.  Pertinent labs & imaging results that were available during my care of the patient were reviewed by me and considered in my medical decision making (see chart for details).  Viral gastroenteritis  Discussed etiology of symptoms, timeline and possible resolution with patient, fever of 102.6 in triage, given Tylenol and administered Zofran ODT for management of vomiting, no tenderness noted on exam, low suspicion for acute abdomen, stable for outpatient management, Zofran prescribed, recommended use of over-the-counter Imodium, advised patient to increase fluid intake with water or electrolyte replacement substances such as Gatorade or similar product to prevent dehydration, recommend over-the-counter Tylenol or ibuprofen for management of fevers, given strict precautions for worsening severity of abdominal pain or persistent vomiting and diarrhea past use of medication to go to nearest emergency department for further evaluation of symptoms Final Clinical Impressions(s) / UC Diagnoses   Final diagnoses:  None   Discharge Instructions   None    ED Prescriptions   None    PDMP not reviewed this encounter.   Valinda Hoar, [place] 10/07/21 [phone]

## 2022-02-08 ENCOUNTER — Other Ambulatory Visit: Payer: Self-pay

## 2022-02-08 ENCOUNTER — Ambulatory Visit
Admission: EM | Admit: 2022-02-08 | Discharge: 2022-02-08 | Disposition: A | Discharge status: Home / Self Care | Payer: No Typology Code available for payment source | Attending: Emergency Medicine | Admitting: Emergency Medicine

## 2022-02-08 DIAGNOSIS — J029 Acute pharyngitis, unspecified: Secondary | ICD-10-CM | POA: Insufficient documentation

## 2022-02-08 HISTORY — DX: Dermatitis, unspecified: L30.9

## 2022-02-08 LAB — GROUP A STREP BY PCR: Group A Strep by PCR: NOT DETECTED

## 2022-02-08 MED ORDER — AZITHROMYCIN 250 MG PO TABS: 250.0000 mg | ORAL_TABLET | Freq: Every day | ORAL | 0 refills | Status: DC

## 2022-02-08 NOTE — ED Triage Notes (Signed)
Patient is here for "sorethroat" for about 3 days. No fever. No new/unexplained rash (history of eczema). No cough. No runny nose. No recent illness or treatment for any illness.

## 2022-02-08 NOTE — Discharge Instructions (Signed)
Take the Azithromycin as directed for 5 days for treatment of your strep throat.  Gargle with warm salt water 2-3 times a day to soothe your throat, aid in pain relief, and aid in healing.  Take over-the-counter ibuprofen according to the package instructions as needed for pain.  You can also use Chloraseptic or Sucrets lozenges, 1 lozenge every 2 hours as needed for throat pain.  If you develop any new or worsening symptoms return for reevaluation.

## 2022-02-08 NOTE — ED Provider Notes (Signed)
[facility]    CSN: [id number] Arrival date & time: 02/08/22  5320      History   Chief Complaint Chief Complaint  Patient presents with   Sore Throat    HPI Charles Whitehead is a 31 y.o. male.   HPI  31 year old male here for evaluation of sore throat.  Patient reports that he has been experiencing a sore throat for last 3 days.  He states that it hurts to swallow and his pain is more down further in his neck on the side of his epiglottis.  He states that he did feel feverish last night but he did not measure a fever.  His son has been sick with similar symptoms.  He denies any runny nose, nasal congestion, ear pain, or cough.  Past Medical History:  Diagnosis Date   Eczema     Patient Active Problem List   Diagnosis Date Noted   Chlamydia contact 07/09/2020   Screening examination for STD (sexually transmitted disease) 12/27/2019   Chlamydia infection 12/27/2019    Past Surgical History:  Procedure Laterality Date   NO PAST SURGERIES         Home Medications    Prior to Admission medications   Medication Sig Start Date End Date Taking? Authorizing Provider  azithromycin (ZITHROMAX Z-PAK) 250 MG tablet Take 1 tablet (250 mg total) by mouth daily. Take 2 tablets on the first day and then 1 tablet daily thereafter for a total of 5 days of treatment. 02/08/22  Yes Becky Augusta, NP    Family History Family History  Problem Relation Age of Onset   Diabetes Mother    Other Father        unknown medical history    Social History Social History   Tobacco Use   Smoking status: Former    Types: Cigars   Smokeless tobacco: Never  Vaping Use   Vaping Use: Never used  Substance Use Topics   Alcohol use: Not Currently   Drug use: Not Currently    Frequency: 14.0 times per week    Types: Marijuana    Comment: Haven't last 3 days.     Allergies   Lac bovis and Lactose intolerance (gi)   Review of Systems Review of Systems  Constitutional:   Positive for fever.  HENT:  Positive for sore throat. Negative for congestion, ear pain and rhinorrhea.   Respiratory:  Negative for cough.   Hematological: Negative.   Psychiatric/Behavioral: Negative.      Physical Exam Triage Vital Signs ED Triage Vitals  Enc Vitals Group     BP 02/08/22 0844 140/88     Pulse Rate 02/08/22 0844 64     Resp 02/08/22 0844 18     Temp 02/08/22 0844 98.3 F (36.8 C)     Temp Source 02/08/22 0844 Oral     SpO2 02/08/22 0844 100 %     Weight 02/08/22 0841 145 lb (65.8 kg)     Height 02/08/22 0841 5\' 7"  (1.702 m)     Head Circumference --      Peak Flow --      Pain Score 02/08/22 0838 8     Pain Loc --      Pain Edu? --      Excl. in GC? --    No data found.  Updated Vital Signs BP 140/88 (BP Location: Left Arm)   Pulse 64   Temp 98.3 F (36.8 C) (Oral)   Resp 18  Ht 5\' 7"  (1.702 m)   Wt 145 lb (65.8 kg)   SpO2 100%   BMI 22.71 kg/m   Visual Acuity Right Eye Distance:   Left Eye Distance:   Bilateral Distance:    Right Eye Near:   Left Eye Near:    Bilateral Near:     Physical Exam Vitals and nursing note reviewed.  Constitutional:      Appearance: Normal appearance. He is obese. He is not ill-appearing.  HENT:     Head: Normocephalic and atraumatic.     Right Ear: Tympanic membrane, ear canal and external ear normal. There is no impacted cerumen.     Left Ear: Tympanic membrane, ear canal and external ear normal. There is no impacted cerumen.     Nose: Nose normal. No congestion or rhinorrhea.     Mouth/Throat:     Mouth: Mucous membranes are moist.     Pharynx: Oropharynx is clear. Posterior oropharyngeal erythema present. No oropharyngeal exudate.  Cardiovascular:     Rate and Rhythm: Normal rate and regular rhythm.     Pulses: Normal pulses.     Heart sounds: Normal heart sounds. No murmur heard.   No friction rub. No gallop.  Pulmonary:     Effort: Pulmonary effort is normal.     Breath sounds: Normal breath  sounds. No stridor. No wheezing, rhonchi or rales.  Musculoskeletal:     Cervical back: Normal range of motion and neck supple. Tenderness present.  Lymphadenopathy:     Cervical: No cervical adenopathy.  Skin:    General: Skin is warm and dry.     Capillary Refill: Capillary refill takes less than 2 seconds.     Findings: No erythema or rash.  Neurological:     General: No focal deficit present.     Mental Status: He is alert and oriented to person, place, and time.  Psychiatric:        Mood and Affect: Mood normal.        Behavior: Behavior normal.        Thought Content: Thought content normal.        Judgment: Judgment normal.     UC Treatments / Results  Labs (all labs ordered are listed, but only abnormal results are displayed) Labs Reviewed  GROUP A STREP BY PCR    EKG   Radiology No results found.  Procedures Procedures (including critical care time)  Medications Ordered in UC Medications - No data to display  Initial Impression / Assessment and Plan / UC Course  I have reviewed the triage vital signs and the nursing notes.  Pertinent labs & imaging results that were available during my care of the patient were reviewed by me and considered in my medical decision making (see chart for details).  Patient is a pleasant, nontoxic-appearing 31 year old male here for evaluation of sore throat as outlined in HPI above.  His physical exam reveals pearly-gray tympanic membranes bilaterally with normal light reflex and clear external auditory canals.  Nasal mucosa is pink and moist without erythema, edema, or discharge.  Oropharyngeal exam reveals erythema and 1+ edema to bilateral tonsillar pillars as well as the soft palate and uvula.  The posterior oropharynx is readily visualized and there is no significant erythema, injection, or postnasal drip.  No anterior cervical lymphadenopathy appreciated on exam.  The patient does complain of tenderness to the anterior aspect of  his neck near the level of his larynx.  He is able to speak  in complete sentences and there is no audible stridor.  I did not auscultate over the trachea.  His cardiopulmonary exam reveals S1-S2 heart sounds with regular rate and rhythm and lung sounds that are clear to auscultation in all fields.  Patient has normal chest excursion.  Strep PCR was collected at triage and is pending.  Strep PCR is negative.  Given the patient's tenderness down near his epiglottis as well as the erythema and injection of his uvula, soft palate, and bilateral tonsillar pillars I am suspicious that patient may have pharyngitis caused by another infectious organism such as haemophilus influenza or another form of strep.  I will do a trial of azithromycin to see if the patient's symptoms improve.  I have also encouraged him to perform salt water gargles, use over-the-counter NSAIDs as needed for pain and inflammation, as well as over-the-counter Sucrets or Chloraseptic lozenges to ease the pain.  Return precautions reviewed.   Final Clinical Impressions(s) / UC Diagnoses   Final diagnoses:  Acute pharyngitis, unspecified etiology     Discharge Instructions      Take the Azithromycin as directed for 5 days for treatment of your strep throat.  Gargle with warm salt water 2-3 times a day to soothe your throat, aid in pain relief, and aid in healing.  Take over-the-counter ibuprofen according to the package instructions as needed for pain.  You can also use Chloraseptic or Sucrets lozenges, 1 lozenge every 2 hours as needed for throat pain.  If you develop any new or worsening symptoms return for reevaluation.      ED Prescriptions     Medication Sig Dispense Auth. Provider   azithromycin (ZITHROMAX Z-PAK) 250 MG tablet Take 1 tablet (250 mg total) by mouth daily. Take 2 tablets on the first day and then 1 tablet daily thereafter for a total of 5 days of treatment. 6 tablet Becky Augustayan, Edythe Riches, NP      PDMP not  reviewed this encounter.   Becky Augustayan, Theodore Rahrig, NP 02/08/22 [phone]

## 2022-07-28 ENCOUNTER — Encounter: Payer: Self-pay | Admitting: Emergency Medicine

## 2022-07-28 ENCOUNTER — Ambulatory Visit
Admission: EM | Admit: 2022-07-28 | Discharge: 2022-07-28 | Disposition: A | Discharge status: Home / Self Care | Payer: Self-pay | Attending: Internal Medicine | Admitting: Internal Medicine

## 2022-07-28 DIAGNOSIS — B9689 Other specified bacterial agents as the cause of diseases classified elsewhere: Secondary | ICD-10-CM

## 2022-07-28 DIAGNOSIS — H109 Unspecified conjunctivitis: Secondary | ICD-10-CM

## 2022-07-28 DIAGNOSIS — J029 Acute pharyngitis, unspecified: Secondary | ICD-10-CM

## 2022-07-28 DIAGNOSIS — J040 Acute laryngitis: Secondary | ICD-10-CM

## 2022-07-28 MED ORDER — POLYMYXIN B-TRIMETHOPRIM 10000-0.1 UNIT/ML-% OP SOLN: 1.0000 [drp] | Freq: Three times a day (TID) | OPHTHALMIC | 0 refills | Status: AC

## 2022-07-28 MED ORDER — PREDNISONE 20 MG PO TABS: 20.0000 mg | ORAL_TABLET | Freq: Every day | ORAL | 0 refills | Status: AC

## 2022-07-28 NOTE — ED Triage Notes (Signed)
Pt presents with a sore throat x 2 weeks. He also c/o bilateral eye irritation and drainage. The left eye x 2 weeks the right eye today.

## 2022-07-28 NOTE — ED Provider Notes (Signed)
[facility]    CSN: [id number] Arrival date & time: 07/28/22  1948      History   Chief Complaint Chief Complaint  Patient presents with   Conjunctivitis   Sore Throat    HPI Charles Whitehead is a 31 y.o. male who presents with hx of ST x 2 weeks, and L eye drainage and irritation x 2 weeks, and today the R eye started draining. Has been horsed. States his ST is on lower throat area and is provoked with swallowing, and coughing. Denies having any URI or fever.     Past Medical History:  Diagnosis Date   Eczema     Patient Active Problem List   Diagnosis Date Noted   Chlamydia contact 07/09/2020   Screening examination for STD (sexually transmitted disease) 12/27/2019   Chlamydia infection 12/27/2019    Past Surgical History:  Procedure Laterality Date   NO PAST SURGERIES         Home Medications    Prior to Admission medications   Medication Sig Start Date End Date Taking? Authorizing Provider  predniSONE (DELTASONE) 20 MG tablet Take 1 tablet (20 mg total) by mouth daily with breakfast. 07/28/22  Yes Rodriguez-Southworth, Nettie Elm, PA-C  trimethoprim-polymyxin b (POLYTRIM) ophthalmic solution Place 1 drop into both eyes 3 (three) times daily. For 7 days 07/28/22  Yes Rodriguez-Southworth, Nettie Elm, PA-C    Family History Family History  Problem Relation Age of Onset   Diabetes Mother    Other Father        unknown medical history    Social History Social History   Tobacco Use   Smoking status: Former    Types: Cigars   Smokeless tobacco: Never  Vaping Use   Vaping Use: Never used  Substance Use Topics   Alcohol use: Not Currently   Drug use: Not Currently    Frequency: 14.0 times per week    Types: Marijuana    Comment: Haven't last 3 days.     Allergies   Lactose intolerance (gi) and Milk (cow)   Review of Systems Review of Systems  Constitutional:  Negative for chills and fever.  HENT:  Positive for sore throat and voice  change. Negative for congestion, ear pain, postnasal drip, rhinorrhea and trouble swallowing.   Eyes:  Positive for discharge and itching. Negative for photophobia, pain and redness.  Respiratory:  Negative for cough.   Musculoskeletal:  Negative for gait problem and myalgias.  Hematological:  Negative for adenopathy.     Physical Exam Triage Vital Signs ED Triage Vitals  Enc Vitals Group     BP 07/28/22 1953 138/79     Pulse Rate 07/28/22 1953 80     Resp 07/28/22 1953 16     Temp 07/28/22 1953 98.4 F (36.9 C)     Temp Source 07/28/22 1953 Oral     SpO2 07/28/22 1953 96 %     Weight --      Height --      Head Circumference --      Peak Flow --      Pain Score 07/28/22 1952 8     Pain Loc --      Pain Edu? --      Excl. in GC? --    No data found.  Updated Vital Signs BP 138/79 (BP Location: Right Arm)   Pulse 80   Temp 98.4 F (36.9 C) (Oral)   Resp 16   SpO2 96%   Visual Acuity  Right Eye Distance:   Left Eye Distance:   Bilateral Distance:    Right Eye Near:   Left Eye Near:    Bilateral Near:     Physical Exam Vitals and nursing note reviewed.  Constitutional:      General: He is not in acute distress.    Appearance: He is normal weight. He is not toxic-appearing.  HENT:     Right Ear: Tympanic membrane and ear canal normal.     Left Ear: Tympanic membrane and ear canal normal.     Mouth/Throat:     Mouth: Mucous membranes are dry.     Pharynx: Oropharynx is clear. No pharyngeal swelling, oropharyngeal exudate, posterior oropharyngeal erythema or uvula swelling.     Tonsils: No tonsillar exudate or tonsillar abscesses. 1+ on the right. 1+ on the left.  Eyes:     Conjunctiva/sclera: Conjunctivae normal.  Pulmonary:     Effort: Pulmonary effort is normal.     Breath sounds: Normal breath sounds.  Musculoskeletal:     Cervical back: Neck supple.  Lymphadenopathy:     Cervical: No cervical adenopathy.  Skin:    General: Skin is warm and dry.   Neurological:     Mental Status: He is alert.  Psychiatric:        Mood and Affect: Mood normal.        Behavior: Behavior normal.      UC Treatments / Results  Labs (all labs ordered are listed, but only abnormal results are displayed) Labs Reviewed - No data to display  EKG   Radiology No results found.  Procedures Procedures (including critical care time)  Medications Ordered in UC Medications - No data to display  Initial Impression / Assessment and Plan / UC Course  I have reviewed the triage vital signs and the nursing notes.  Viral pharyngitis Laryngitis Bilateral bacterial conjunctivitis   I placed him on Prednisone and Polytrim as noted.  Final Clinical Impressions(s) / UC Diagnoses   Final diagnoses:  Bacterial conjunctivitis of both eyes  Laryngitis  Viral pharyngitis   Discharge Instructions   None    ED Prescriptions     Medication Sig Dispense Auth. Provider   predniSONE (DELTASONE) 20 MG tablet Take 1 tablet (20 mg total) by mouth daily with breakfast. 5 tablet Rodriguez-Southworth, Sunday Spillers, PA-C   trimethoprim-polymyxin b (POLYTRIM) ophthalmic solution Place 1 drop into both eyes 3 (three) times daily. For 7 days 10 mL Rodriguez-Southworth, Sunday Spillers, PA-C      PDMP not reviewed this encounter.   Shelby Mattocks, Hershal Coria 07/28/22 2008

## 2024-08-11 ENCOUNTER — Ambulatory Visit
Admission: RE | Admit: 2024-08-11 | Discharge: 2024-08-11 | Disposition: A | Discharge status: Home / Self Care | Payer: Self-pay | Source: Ambulatory Visit | Attending: Family Medicine | Admitting: Family Medicine

## 2024-08-11 NOTE — ED Triage Notes (Signed)
 Called 2nd time from lobby. NO answer

## 2024-08-11 NOTE — ED Triage Notes (Signed)
Called once from lobby to no answer
